# Patient Record
Sex: Male | Born: 1935 | Race: White | Hispanic: No | Marital: Married | Smoking: Former smoker
Health system: Southern US, Community
[De-identification: ages and names within clinical notes are randomized; demographics above are authoritative.]

## PROBLEM LIST (undated history)

## (undated) DIAGNOSIS — N184 Chronic kidney disease, stage 4 (severe): Secondary | ICD-10-CM

## (undated) DIAGNOSIS — R809 Proteinuria, unspecified: Secondary | ICD-10-CM

## (undated) DIAGNOSIS — Z87442 Personal history of urinary calculi: Secondary | ICD-10-CM

## (undated) DIAGNOSIS — Z8739 Personal history of other diseases of the musculoskeletal system and connective tissue: Secondary | ICD-10-CM

## (undated) DIAGNOSIS — M1990 Unspecified osteoarthritis, unspecified site: Secondary | ICD-10-CM

## (undated) DIAGNOSIS — E7800 Pure hypercholesterolemia, unspecified: Secondary | ICD-10-CM

## (undated) DIAGNOSIS — I10 Essential (primary) hypertension: Secondary | ICD-10-CM

## (undated) DIAGNOSIS — I350 Nonrheumatic aortic (valve) stenosis: Secondary | ICD-10-CM

## (undated) DIAGNOSIS — K219 Gastro-esophageal reflux disease without esophagitis: Secondary | ICD-10-CM

## (undated) DIAGNOSIS — I2510 Atherosclerotic heart disease of native coronary artery without angina pectoris: Secondary | ICD-10-CM

## (undated) HISTORY — DX: Chronic kidney disease, stage 4 (severe): N18.4

## (undated) HISTORY — PX: CORONARY ANGIOPLASTY: SHX604

## (undated) HISTORY — DX: Proteinuria, unspecified: R80.9

## (undated) HISTORY — PX: CATARACT EXTRACTION: SUR2

## (undated) HISTORY — DX: Essential (primary) hypertension: I10

## (undated) HISTORY — DX: Atherosclerotic heart disease of native coronary artery without angina pectoris: I25.10

## (undated) HISTORY — DX: Nonrheumatic aortic (valve) stenosis: I35.0

---

## 2006-08-05 ENCOUNTER — Ambulatory Visit (HOSPITAL_COMMUNITY): Admission: RE | Admit: 2006-08-05 | Discharge: 2006-08-05 | Payer: Self-pay | Admitting: Ophthalmology

## 2009-07-02 DIAGNOSIS — I350 Nonrheumatic aortic (valve) stenosis: Secondary | ICD-10-CM

## 2009-07-02 HISTORY — DX: Nonrheumatic aortic (valve) stenosis: I35.0

## 2009-07-03 ENCOUNTER — Ambulatory Visit (HOSPITAL_COMMUNITY): Admission: RE | Admit: 2009-07-03 | Discharge: 2009-07-03 | Payer: Self-pay | Admitting: Internal Medicine

## 2009-07-03 ENCOUNTER — Ambulatory Visit: Payer: Self-pay | Admitting: Cardiology

## 2009-07-03 ENCOUNTER — Encounter (INDEPENDENT_AMBULATORY_CARE_PROVIDER_SITE_OTHER): Payer: Self-pay | Admitting: Internal Medicine

## 2009-08-07 ENCOUNTER — Ambulatory Visit: Payer: Self-pay | Admitting: Cardiology

## 2009-08-09 ENCOUNTER — Encounter: Payer: Self-pay | Admitting: Cardiology

## 2009-08-22 ENCOUNTER — Ambulatory Visit: Payer: Self-pay | Admitting: Cardiology

## 2009-08-22 ENCOUNTER — Encounter: Payer: Self-pay | Admitting: Cardiology

## 2009-08-22 ENCOUNTER — Ambulatory Visit (HOSPITAL_COMMUNITY): Admission: RE | Admit: 2009-08-22 | Discharge: 2009-08-22 | Payer: Self-pay | Admitting: Cardiology

## 2009-08-26 ENCOUNTER — Ambulatory Visit: Payer: Self-pay | Admitting: Cardiology

## 2009-08-26 DIAGNOSIS — I10 Essential (primary) hypertension: Secondary | ICD-10-CM | POA: Insufficient documentation

## 2009-08-26 LAB — CONVERTED CEMR LAB
BUN: 23 mg/dL (ref 6–23)
Basophils Absolute: 0 10*3/uL (ref 0.0–0.1)
CO2: 28 meq/L (ref 19–32)
Calcium: 9.6 mg/dL (ref 8.4–10.5)
Eosinophils Absolute: 0.1 10*3/uL (ref 0.0–0.7)
Eosinophils Relative: 2 % (ref 0–5)
Lymphocytes Relative: 35 % (ref 12–46)
MCV: 96.9 fL (ref 78.0–100.0)
Prothrombin Time: 13.3 s (ref 11.6–15.2)
RDW: 14 % (ref 11.5–15.5)
Sodium: 138 meq/L (ref 135–145)
WBC: 6.3 10*3/uL (ref 4.0–10.5)
aPTT: 28 s (ref 24–37)

## 2009-08-27 ENCOUNTER — Observation Stay (HOSPITAL_COMMUNITY)
Admission: RE | Admit: 2009-08-27 | Discharge: 2009-08-28 | Payer: Self-pay | Source: Home / Self Care | Admitting: Cardiovascular Disease

## 2009-08-27 ENCOUNTER — Ambulatory Visit: Payer: Self-pay | Admitting: Internal Medicine

## 2009-08-27 ENCOUNTER — Encounter: Payer: Self-pay | Admitting: Cardiology

## 2009-09-01 DIAGNOSIS — I2510 Atherosclerotic heart disease of native coronary artery without angina pectoris: Secondary | ICD-10-CM

## 2009-09-01 HISTORY — DX: Atherosclerotic heart disease of native coronary artery without angina pectoris: I25.10

## 2009-09-04 ENCOUNTER — Ambulatory Visit: Payer: Self-pay | Admitting: Cardiovascular Disease

## 2009-09-04 ENCOUNTER — Inpatient Hospital Stay (HOSPITAL_COMMUNITY): Admission: RE | Admit: 2009-09-04 | Discharge: 2009-09-05 | Payer: Self-pay | Admitting: Cardiovascular Disease

## 2009-09-26 ENCOUNTER — Ambulatory Visit: Payer: Self-pay | Admitting: Cardiology

## 2009-09-26 DIAGNOSIS — I359 Nonrheumatic aortic valve disorder, unspecified: Secondary | ICD-10-CM | POA: Insufficient documentation

## 2009-09-26 DIAGNOSIS — I2510 Atherosclerotic heart disease of native coronary artery without angina pectoris: Secondary | ICD-10-CM

## 2009-09-26 DIAGNOSIS — E785 Hyperlipidemia, unspecified: Secondary | ICD-10-CM

## 2009-09-27 ENCOUNTER — Encounter: Payer: Self-pay | Admitting: Cardiology

## 2009-10-01 ENCOUNTER — Encounter: Payer: Self-pay | Admitting: Cardiology

## 2009-10-02 ENCOUNTER — Encounter: Payer: Self-pay | Admitting: Cardiology

## 2009-10-02 LAB — CONVERTED CEMR LAB
Bilirubin, Direct: 0.1 mg/dL (ref 0.0–0.3)
Cholesterol: 171 mg/dL (ref 0–200)
HDL: 25 mg/dL — ABNORMAL LOW (ref >39)
LDL Cholesterol: 116 mg/dL — ABNORMAL HIGH (ref 0–99)

## 2009-10-15 ENCOUNTER — Encounter: Payer: Self-pay | Admitting: Cardiology

## 2009-11-25 ENCOUNTER — Encounter (HOSPITAL_COMMUNITY): Admission: RE | Admit: 2009-11-25 | Discharge: 2009-12-25 | Payer: Self-pay | Admitting: Cardiology

## 2009-12-06 ENCOUNTER — Encounter (INDEPENDENT_AMBULATORY_CARE_PROVIDER_SITE_OTHER): Payer: Self-pay | Admitting: *Deleted

## 2009-12-26 ENCOUNTER — Encounter (HOSPITAL_COMMUNITY): Admission: RE | Admit: 2009-12-26 | Discharge: 2010-01-25 | Payer: Self-pay | Admitting: Cardiology

## 2010-01-03 ENCOUNTER — Encounter: Payer: Self-pay | Admitting: Cardiology

## 2010-01-03 LAB — CONVERTED CEMR LAB
ALT: 9 units/L (ref 0–53)
Albumin: 4.1 g/dL (ref 3.5–5.2)
Alkaline Phosphatase: 62 units/L (ref 39–117)
Cholesterol: 105 mg/dL (ref 0–200)
Indirect Bilirubin: 0.3 mg/dL (ref 0.0–0.9)
Total Protein: 7.2 g/dL (ref 6.0–8.3)
VLDL: 25 mg/dL (ref 0–40)

## 2010-01-15 ENCOUNTER — Ambulatory Visit: Payer: Self-pay | Admitting: Cardiology

## 2010-01-22 ENCOUNTER — Encounter: Payer: Self-pay | Admitting: Cardiology

## 2010-01-27 ENCOUNTER — Encounter (HOSPITAL_COMMUNITY): Admission: RE | Admit: 2010-01-27 | Discharge: 2010-02-26 | Payer: Self-pay | Admitting: Cardiology

## 2010-04-15 LAB — CONVERTED CEMR LAB
ALT: 8 units/L (ref 0–53)
AST: 13 units/L (ref 0–37)
Albumin: 4.2 g/dL (ref 3.5–5.2)
Bilirubin, Direct: 0.1 mg/dL (ref 0.0–0.3)
LDL Cholesterol: 76 mg/dL (ref 0–99)
Total CHOL/HDL Ratio: 4.8
Total Protein: 7.2 g/dL (ref 6.0–8.3)
VLDL: 27 mg/dL (ref 0–40)

## 2010-04-17 ENCOUNTER — Encounter (INDEPENDENT_AMBULATORY_CARE_PROVIDER_SITE_OTHER): Payer: Self-pay | Admitting: *Deleted

## 2010-04-17 ENCOUNTER — Ambulatory Visit: Payer: Self-pay | Admitting: Cardiology

## 2010-06-03 NOTE — Miscellaneous (Signed)
Summary:  CARDIAC  [facility]   Imported By: Faythe Ghee 01/22/2010 12:57:40  _____________________________________________________________________  External Attachment:    Type:   Image     Comment:   External Document

## 2010-06-03 NOTE — Assessment & Plan Note (Signed)
Summary: ROV POST STRESS ECHO   Visit Type:  Follow-up Primary Provider:  Dr. Dwana Melena   History of Present Illness: 77 year old male presents for a followup visit. He is here with his son today. He was seen recently for evaluation of syncope, that by description was suggestive of a neurocardiogenic etiology. He had also been experiencing shortness of breath with activity more chronically, and was referred for baseline ischemic evaluation in light of his cardiac risk factors.  Exercise echocardiogram results are outlined below, significantly abnormal, and suggestive of ischemic heart disease. I reviewed these results with the patient and his son, explaining the implications, and rationale for further diagnostic testing. We discussed the potential risks and benefits of a diagnostic heart catheterization, and he is in agreement to proceed.  From a symptom perspective, and denies any angina. He has not required any sublingual nitroglycerin. He reports stable NYHA class 2-3 dyspnea on exertion.  Current Medications (verified): 1)  Lisinopril-Hydrochlorothiazide 10-12.5 Mg Tabs (Lisinopril-Hydrochlorothiazide) .... Take 1 Tablet By Mouth Once A Day 2)  Omeprazole 20 Mg Cpdr (Omeprazole) .... Take 1 Tablet By Mouth Once A Day 3)  Aspir-Trin 325 Mg Tbec (Aspirin) .... Take 1 Tab Daily  Allergies (verified): 1)  ! 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 Wall thickness was     increased increased in a pattern of mild to moderate LVH. There     was moderate focal basal hypertrophy of the septum. Systolic     function was normal. The estimated ejection fraction was in the     range of 60% to 65%. Wall motion was normal; there were no     regional wall motion abnormalities.   - Aortic valve: Moderately calcified annulus. Possibly bicuspid;     mildly thickened, moderately calcified leaflets. Transvalvular     velocity was minimally increased. There was very mild stenosis.     Valve area: 1.83cm^2(VTI). Valve area: 1.74cm^2 (Vmax).   - Mitral valve: Calcified annulus.   - Left atrium: The atrium was mildly dilated.   - Pulmonary arteries: PA peak pressure: 32mm Hg (S).   Transthoracic echocardiography. M-mode, complete 2D, spectral   Doppler, and color Doppler. Height: Height: 177.8cm. Height: 70in.   Weight: Weight: 75.3kg. Weight: 165.7lb. Body mass index: BMI:   23.8kg/m^2. Body surface area: BSA: 1.10m^2. Patient status:   Outpatient. Location: Echo laboratory.    --------------------------------------------------------------------    -------------------------------------------------------------------- (07/03/2009)    Review of Systems       The patient complains of dyspnea on exertion.  The patient denies fever, chest pain, syncope, peripheral edema, headaches, hemoptysis, abdominal pain, melena, hematochezia, and severe indigestion/heartburn.         Otherwise reviewed and negative.  Vital Signs:  Patient profile:   77 year old male Weight:      162 pounds Pulse rate:   61 / minute BP sitting:   121 / 60  (right arm)  Vitals Entered By: Dreama Saa, CNA (  August 26, 2009 12:45 PM)  Physical Exam  Additional Exam:  Well-nourished appearing elderly male in no acute distress. HEENT: Conjunctiva and lids normal,  oropharynx with moist mucosa. Neck: Supple, no elevated JVP or loud bruits, no thyromegaly. Lungs: Clear to auscultation, nonlabored. Cardiac: Regular rate and rhythm, 2/6 systolic murmur heard at the base, second heart sound preserved, no S3 or pericardial rub. Abdomen: Soft, nontender, bowel sounds present. Skin: Warm and dry. Extremities: No pitting edema, distal pulses are 2+. Musculoskeletal: No gross deformities. Neuropsychiatric: Alert and oriented x3, affect appropriate.   Stress Echocardiogram  Procedure date:  08/22/2009  Findings:       CONCLUSIONS:   1. Significantly abnormal ST-segment changes consistent with ischemia       as noted above, at a maximum workload of 7.0 METs.  Peak blood       pressure was 170/62, and the patient experienced no chest pain,       with only mild dyspnea on exertion.  ST-segment changes resolved by       11 minutes in recovery.  No other dysrhythmias were       noted.   2. Inducible anteroseptal, apical, and inferoapical akinesis       consistent with ischemia, at least in the left anterior descending       distribution, potentially multivessel based on degree of ST-segment       change.  EKG  Procedure date:  08/26/2009  Findings:      Normal sinus rhythm at 61 beats per minute with high lateral Q waves.  Impression & Recommendations:  Problem # 1:  CARDIOVASCULAR FUNCTION STUDY, ABNORMAL (ICD-794.30)  Significantly abnormal screening exercise echocardiogram, with ST segment abnormalities and inducible wall motion abnormalities, suggestive of underlying ischemic heart disease, possibly even multivessel. Results were discussed with the patient and his son. Plan is for a diagnostic cardiac catheterization tomorrow for further definition of coronary anatomy, and evaluation for revascularization options. Patient will continue on his present medications, which at this point are fairly limited. He is otherwise hemodynamically stable. Further  plans to follow. Baseline chest x-ray and labs will be obtained today.  Problem # 2:  ESSENTIAL HYPERTENSION, BENIGN (ICD-401.1)  Blood pressure well controlled today.  His updated medication list for this problem includes:    Lisinopril-hydrochlorothiazide 10-12.5 Mg Tabs (Lisinopril-hydrochlorothiazide) .Marland Kitchen... Take 1 tablet by mouth once a day    Aspir-trin 325 Mg Tbec (Aspirin) .Marland Kitchen... Take 1 tab daily  Problem # 3:  DYSPNEA (ICD-786.05)  Possibly ischemic in etiology. Further testing as outlined above.  His updated medication list for this problem includes:    Lisinopril-hydrochlorothiazide 10-12.5 Mg Tabs (Lisinopril-hydrochlorothiazide) .Marland Kitchen... Take 1 tablet by mouth once a day    Aspir-trin 325 Mg Tbec (Aspirin) .Marland Kitchen... Take 1 tab daily  Problem # 4:  SYNCOPE, HX OF (ICD-V12.49)  No recent recurrence. Description was most consistent with a neurocardiogenic etiology. Not clearly correlated with underlying ischemic heart disease, although further assessment is pending.  Other Orders: T-Basic Metabolic Panel [id number]) T-CBC w/Diff [phone]) T-PTT [phone]) T-Protime, Auto [phone]) Cardiac Catheterization (Cardiac Cath) T-Chest x-ray, 2 views ([id number])  Patient Instructions: 1)  Your physician recommends that you schedule a follow-up appointment in: post Cath 2)  Your physician recommends that you return for lab work in: Today  3)  A chest x-ray takes a picture of the organs and structures inside the chest, including the heart, lungs, and blood vessels. This test can show several things, including, whether the  heart is enlarged; whether fluid is building up in the lungs; and whether pacemaker / defibrillator leads are still in place. 4)  Your physician has requested that you have a cardiac catheterization.  Cardiac catheterization is used to diagnose and/or treat various heart conditions. Doctors may recommend this procedure for a number of different reasons. The most  common reason is to evaluate chest pain. Chest pain can be a symptom of coronary artery disease (CAD), and cardiac catheterization can show whether plaque is narrowing or blocking your heart's arteries. This procedure is also used to evaluate the valves, as well as measure the blood flow and oxygen levels in different parts of your heart.  For further information please visit [email or web address]  Please follow instruction sheet, as given.

## 2010-06-03 NOTE — Letter (Signed)
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 [place] [address]   Phone: [phone]  Fax: [phone]     October 02, 2009 MRN: [id number]   MOUA RASMUSSON [address] [place], [place]  [address]      YOUR LAB WORK IS DUE  December 30, 2009 _________________________________________  Please go to [facility], located across the street from [facility] on the second floor.  Hours are Monday - Friday 7am until 7:30pm         Saturday 8am until 12noon    _X_  DO NOT EAT OR DRINK AFTER MIDNIGHT EVENING PRIOR TO LABWORK  __ YOUR LABWORK IS NOT FASTING --YOU MAY EAT PRIOR TO LABWORK

## 2010-06-03 NOTE — Letter (Signed)
Summary: [place] Results Engineer, agricultural at [facility]  [address] [place] [address]   Phone: [phone]  Fax: [phone]      January 03, 2010 MRN: [id number]   Brian [name] [address] [place], [place]  [address]   Dear [name],  Your test ordered by Selena Batten has been reviewed by your physician (or physician assistant) and was found to be normal or stable. Your physician (or physician assistant) felt no changes were needed at this time.  ____ Echocardiogram  ____ Cardiac Stress Test  __X__ Lab Work  ____ Peripheral vascular study of arms, legs or neck  ____ CT scan or X-ray  ____ Lung or Breathing test  ____ Other:  Pleae continue on current medical treatment.   Thank you.   Nona Dell, MD, F.A.C.C

## 2010-06-03 NOTE — Letter (Signed)
Summary: Stress Echocardiogram Information Sheet  [facility]  [address], [place] [address]   Phone: [phone]  Fax: [phone]      August 07, 2009 MRN: [id number] light prior to the test.   Brandy Hale  Doctor: Appointment Date: Appointment Time: Appointment Location: [facility]  Stress Echocardiogram Information Sheet    Instructions:   1. DO NOT  take your Hydrochlorothide medicine the morning of test.  2. Do not eat or drink after midnight the night before test.  3. Dress prepared to exercise.  4. DO NOT use ANY caffine or tobacco products 3 hours before appointment.  5. Report to the Short Stay Center on the1st floor.  6. Please bring all current prescription medications.  7. If you have any questions, please call [phone]

## 2010-06-03 NOTE — Assessment & Plan Note (Signed)
Summary: 58MONTH/RM   Visit Type:  Follow-up Primary Brian [name]:  Dr. Dwana Melena   History of Present Illness: 77 year old male presents for followup. I saw him back in May. He has been working with cardiac rehabilitation. He reports doing quite well without any significant angina or unusual breathlessness. No syncope. 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  Followup labs on 29 August showed cholesterol 105, triglycerides 126, HDL 25, LDL 55, AST 15, ALT 9. Results done in followup while on Zocor. I reviewed these with him today. He is tolerating Zocor.  I underscored the importance of continued dual antiplatelet therapy for one year following intervention with DES to the circumflex.  Current Medications (verified): 1)  Lisinopril-Hydrochlorothiazide 10-12.5 Mg Tabs (Lisinopril-Hydrochlorothiazide) .... Take 1 Tablet By Mouth Once A Day 2)  Aspir-Trin 325 Mg Tbec (Aspirin) .... Take 1 Tab Daily 3)  Tylenol 325 Mg Tabs (Acetaminophen) .... Take As Needed For Pain 4)  Toprol Xl 25 Mg Xr24h-Tab (Metoprolol Succinate) .... Take 1 Tab Daily 5)  Pepcid 20 Mg Tabs (Famotidine) .... Take 1 Tab Daily 6)  Plavix 75 Mg Tabs (Clopidogrel Bisulfate) .... Take 1 Tab Daily 7)  Nitrostat 0.4 Mg Subl (Nitroglycerin) .... Take As Directed For Chest Pain Not To Exceed 3 8)  Zocor 20 Mg Tabs (Simvastatin) .... Take 1 Tablet By Mouth At Bedtime  Allergies (verified): 1)  ! 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         Otherwise reviewed and negative.  Vital Signs:  Patient profile:   77 year old male Weight:      161 pounds BMI:     23.18 Pulse rate:   58 / minute BP sitting:   121 / 41  (right arm)  Vitals Entered By: Dreama Saa, CNA (January 15, 2010 1:06 PM)  Physical Exam  Additional Exam:  Well-nourished appearing elderly male in no acute distress. HEENT: Conjunctiva and lids normal, oropharynx with moist mucosa. Neck: Supple, no elevated JVP or loud bruits, no thyromegaly. Lungs: Clear to auscultation, nonlabored. Cardiac: Regular rate and rhythm, 2/6 systolic murmur heard at the base, second heart sound preserved, no S3 or pericardial rub. Abdomen: Soft, nontender, bowel sounds present. Skin: Warm and dry. Extremities: No pitting edema, distal pulses are 2+. Stable catheterization sites, right wrist and right groin. Musculoskeletal: No gross deformities. Neuropsychiatric: Alert and oriented x3, affect appropriate.   Impression & Recommendations:  Problem # 1:  CORONARY ATHEROSCLEROSIS NATIVE CORONARY ARTERY (ICD-414.01)  Doing well, symptomatically stable on medical therapy. Continue cardiac rehabilitation. Remains on dual antiplatelet therapy. Followup planned for 3 months.  His updated medication list for this problem includes:    Lisinopril-hydrochlorothiazide 10-12.5 Mg Tabs (Lisinopril-hydrochlorothiazide) .Marland Kitchen... Take 1 tablet by mouth once a day    Aspir-trin 325 Mg Tbec (Aspirin) .Marland Kitchen... Take 1 tab daily    Toprol Xl 25 Mg Xr24h-tab (Metoprolol succinate) .Marland Kitchen... Take 1 tab daily    Plavix 75 Mg Tabs (Clopidogrel bisulfate) .Marland Kitchen... Take 1 tab daily    Nitrostat 0.4  Mg Subl (Nitroglycerin) .Marland Kitchen... Take as directed for chest pain not to exceed 3  Problem # 2:  HYPERLIPIDEMIA (ICD-272.4)  LDL much better control. Liver function tests normal. Followup labs prior to next visit. He is tolerating Zocor.  His updated medication list for this problem  includes:    Zocor 20 Mg Tabs (Simvastatin) .Marland Kitchen... Take 1 tablet by mouth at bedtime  Future Orders: T-Lipid Profile ([id number]) ... 04/14/2010 T-Hepatic Function [phone]) ... 04/14/2010  Problem # 3:  ESSENTIAL HYPERTENSION, BENIGN (ICD-401.1)  Blood pressure well controlled.  His updated medication list for this problem includes:    Lisinopril-hydrochlorothiazide 10-12.5 Mg Tabs (Lisinopril-hydrochlorothiazide) .Marland Kitchen... Take 1 tablet by mouth once a day    Aspir-trin 325 Mg Tbec (Aspirin) .Marland Kitchen... Take 1 tab daily    Toprol Xl 25 Mg Xr24h-tab (Metoprolol succinate) .Marland Kitchen... Take 1 tab daily  Problem # 4:  AORTIC VALVE DISORDERS (ICD-424.1)  Mild aortic valve stenosis, stable.  His updated medication list for this problem includes:    Lisinopril-hydrochlorothiazide 10-12.5 Mg Tabs (Lisinopril-hydrochlorothiazide) .Marland Kitchen... Take 1 tablet by mouth once a day    Toprol Xl 25 Mg Xr24h-tab (Metoprolol succinate) .Marland Kitchen... Take 1 tab daily    Nitrostat 0.4 Mg Subl (Nitroglycerin) .Marland Kitchen... Take as directed for chest pain not to exceed 3  Patient Instructions: 1)  Your physician recommends that you schedule a follow-up appointment in: 3 months 2)  Your physician recommends that you return for lab work in: 3 months, just before next visit 3)  Your physician recommends that you continue on your current medications as directed. Please refer to the Current Medication list given to you today.

## 2010-06-03 NOTE — Miscellaneous (Signed)
Summary: Rehab Report  Rehab Report   Imported By: Faythe Ghee 01/15/2010 16:38:14  _____________________________________________________________________  External Attachment:    Type:   Image     Comment:   External Document

## 2010-06-03 NOTE — Letter (Signed)
Summary: Cardiac Catheterization Instructions- Main Lab   [facility]  [address], [place] [address]   Phone: [phone]  Fax: [phone]     08/26/2009 MRN: [id number]  Brian [name] [address] [place], [place]  [address]  Dear [name], Brian [name] are scheduled for Cardiac Catheterization on   08-27-09            with Dr. Excell Seltzer          .  Please arrive at the [facility] at 11:00am      on the day of your procedure.  1. DIET     __X__ Nothing to eat or drink after midnight except your medications with a sip of water.  2. MAKE SURE YOU TAKE YOUR ASPIRIN.  3. __X___ DO NOT TAKE these medications before your procedure: Lisinopril-Hydrochlorothiazide         ________________________________________________________________________________      __X__ Bonita Quin MAY TAKE ALL of your remaining medications with a small amount of water.         4. Plan for one night stay - bring personal belongings (i.e. toothpaste, toothbrush, etc.)  5. Bring a current list of your medications and current insurance cards.  6. Must have a responsible person to drive you home.   7. Someone must be with you for the first 24 hours after you arrive home.  8. Please wear clothes that are easy to get on and off and wear slip-on shoes.  *Special note: Every effort is made to have your procedure done on time.  Occasionally there are emergencies that present themselves at the hospital that may cause delays.  Please be patient if a delay does occur.  If you have any questions after you get home, please call the office at the number listed above.  Larita Fife Via LPN

## 2010-06-03 NOTE — Letter (Signed)
Summary: HISTORY FORM  HISTORY FORM   Imported By: Faythe Ghee 08/09/2009 09:58:49  _____________________________________________________________________  External Attachment:    Type:   Image     Comment:   External Document

## 2010-06-03 NOTE — Letter (Signed)
Summary: Appointment - Reschedule  [facility]  [address], [place] [address]   Phone: [phone]  Fax: [phone]     December 06, 2009 MRN: [id number]   Brian [name] [address] [place], [place]  [address]   Dear Brian [name],   Due to a change in our office schedule, your appointment on      01/01/10    at   11:00 was changed to 01/01/10 at   9:00. Please contact us at the number listed above with any questions.   Sincerely,  Glass blower/designer

## 2010-06-03 NOTE — Assessment & Plan Note (Signed)
Summary: **PER DR.HALL FOR SYNCOPE/TG   Visit Type:  Initial Consult Primary Provider:  Dr. Dwana Melena   History of Present Illness: 77 year old male presents for a cardiology consultation. He is referred following a recent episode of syncope. 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 He plays the guitar and saxophone among other instruments. He was playing in a performance a few weeks ago, and during the last number while he was singing and playing the guitar, he began to feel "hot" followed by dizziness, diaphoresis, and nausea. He was able to finish the song, walked off the stage, and when he was trying to pull up a chair to be seated, he had an episode of syncope. He was cought by a nearby person, and eased to the floor. After he woke up he had a brief episode of emesis, and then felt "much better." He was out for approximately 1 minute. At no time did he experience any chest pain, palpitations, or breathlessness. This has not recurred since.  He states that when he was a child he used to "pass out" at the site of blood. This has not been a major recurrent symptom however in his adult years.  He states he's felt somewhat "dizzy" since being on his antihypertensive. He also states he feels like he has to drink more fluid since being on this medication.  He also reports an episode of near syncope several years ago when he stood up too quickly.  Unrelated to these symptoms, he has generally felt somewhat more fatigue and shortness of breath with activity over the last year. He does not report any Zacharia exertional chest pain however.  He has only recently established with a primary care provider, having not seen a physician for the preceding 30 years.  Clinical Review Panels:  Echocardiogram Echocardiogram  Study Conclusions    - Left ventricle: The cavity size was normal. Wall thickness was     increased increased in a pattern  of mild to moderate LVH. There     was moderate focal basal hypertrophy of the septum. Systolic     function was normal. The estimated ejection fraction was in the     range of 60% to 65%. Wall motion was normal; there were no     regional wall motion abnormalities.   - Aortic valve: Moderately calcified annulus. Possibly bicuspid;     mildly thickened, moderately calcified leaflets. Transvalvular     velocity was minimally increased. There was very mild stenosis.     Valve area: 1.83cm^2(VTI). Valve area: 1.74cm^2 (Vmax).   - Mitral valve: Calcified annulus.   - Left atrium: The atrium was mildly dilated.   - Pulmonary arteries: PA peak pressure: 32mm Hg (S).   Transthoracic echocardiography. M-mode, complete 2D, spectral   Doppler, and color Doppler. Height: Height: 177.8cm. Height: 70in.   Weight: Weight: 75.3kg. Weight: 165.7lb. Body mass index: BMI:   23.8kg/m^2. Body surface area: BSA: 1.57m^2. Patient status:   Outpatient. Location: Echo laboratory.    --------------------------------------------------------------------    -------------------------------------------------------------------- (07/03/2009)    Preventive Screening-Counseling & Management  Alcohol-Tobacco     Smoking Status: quit     Year Quit: 1990  Current Medications (verified): 1)  Lisinopril-Hydrochlorothiazide 10-12.5 Mg Tabs (Lisinopril-Hydrochlorothiazide) .... Take 1 Tablet By Mouth Once A Day 2)  Omeprazole 20 Mg Cpdr (Omeprazole) .... Take 1 Tablet By Mouth Once A Day  Allergies (verified): 1)  ! Codeine  Comments:  Nurse/Medical Assistant: The patient's medications and allergies were reviewed with the patient and were updated in the Medication and Allergy Lists. List reviewed.  Past History:  Family History: Last updated: 08/07/2009 Noncontributory for premature cardiovascular disease Siblings: Brother with pacemaker at age 85  Social History: Last updated: 08/07/2009 Retired -  previously worked with [employer] Tobacco Use - Former (quit 1990) Alcohol Use - no  Past Medical History: Very mild aortic stenosis, possibly bicuspid valve with moderate calcification, 3/11 Hypertension  Past Surgical History: Bilateral cataract surgery  Family History: Noncontributory for premature cardiovascular disease Siblings: Brother with pacemaker at age 62  Social History: Retired - previously worked with [employer] company Tobacco Use - Former (quit 1990) Alcohol Use - no Smoking Status:  quit  Review of Systems  The patient denies anorexia, fever, weight loss, vision loss, chest pain, peripheral edema, prolonged cough, headaches, hemoptysis, abdominal pain, melena, hematochezia, and severe indigestion/heartburn.         Otherwise reviewed and negative.  Vital Signs:  Patient profile:   77 year old male Height:      70 inches Weight:      163 pounds BMI:     23.47 Pulse rate:   62 / minute BP sitting:   149 / 59  (left arm) Cuff size:   regular  Vitals Entered By: Carlye Grippe (August 07, 2009 3:36 PM)  Physical Exam  Additional Exam:  Well-nourished appearing elderly male in no acute distress. HEENT: Conjunctiva and lids normal, oropharynx with moist mucosa. Neck: Supple, no elevated JVP or loud bruits, no thyromegaly. Lungs: Clear to auscultation, nonlabored. Cardiac: Regular rate and rhythm, 2/6 systolic murmur heard at the base, second heart sound preserved, no S3 or pericardial rub. Abdomen: Soft, nontender, bowel sounds present. Skin: Warm and dry. Extremities: No pitting edema, distal pulses are 2+. Musculoskeletal: No gross deformities. Neuropsychiatric: Alert and oriented x3, affect appropriate.   EKG  Procedure date:  08/07/2009  Findings:      Sinus rhythm at 69 beats per minute with counterclockwise rotation, nonspecific ST changes, likely early repolarization.  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 This may also be contributed to by the patient's antihypertensive including a low-dose diuretic. We discussed this some today, and talked about maintaining a adequately hydrated status, and also trying to assume a seated or supine position with warning symptoms. He has had no palpitations to suspect dysrhythmia. Fortunately he also underwent a recent echocardiogram demonstrating normal left ventricular systolic function. He has very mild aortic stenosis, not likely a contributed factor.  Orders: EKG w/ Interpretation (93000)  Problem # 2:  DYSPNEA (ICD-786.05)  Perceived increase in fatigue and shortness of breath with activity over the last year, not associated with exertional chest pain. Resting electrocardiogram is nonspecific. This seems to be separate from the symptoms discussed above. He does have hypertension, and has not had regular medical followup over the last many years. We discussed these issues today, and plan basic ischemic evaluation via an exercise echocardiogram. If this is low risk, would likely continue with symptom observation and medical therapy, otherwise we can consider further evaluation. I will schedule a followup visit to discuss the results.  His updated medication list for this problem includes:    Lisinopril-hydrochlorothiazide 10-12.5 Mg Tabs (Lisinopril-hydrochlorothiazide) .Marland Kitchen... Take 1 tablet by mouth  once a day  Patient Instructions: 1)  Your physician recommends that you schedule a follow-up appointment in: 3 to 4 weeks 2)  Your physician recommends that you continue on your current medications as directed. Please refer to the Current Medication list given to you today. 3)  Your physician has requested that you have a stress echocardiogram. For further information please visit [email or web address]  Please follow instruction sheet as  given.

## 2010-06-03 NOTE — Letter (Signed)
Summary: [facility], agricultural at [employer]  [address], [place] [address]   Phone: [phone]  Fax: [phone]     January 15, 2010 MRN: [id number]   Brian [name] [address] [place], [place]  [address]      YOUR LAB WORK IS DUE  April 14, 2010 _________________________________________  Please go to [facility], located across the street from [facility] on the second floor.  Hours are Monday - Friday 7am until 7:30pm         Saturday 8am until 12noon    _X_  DO NOT EAT OR DRINK AFTER MIDNIGHT EVENING PRIOR TO LABWORK  __ YOUR LABWORK IS NOT FASTING --YOU MAY EAT PRIOR TO LABWORK

## 2010-06-03 NOTE — Miscellaneous (Signed)
Summary: Rehab Report/ FAXED CARDIAC REHAB PROGRAM  Rehab Report/ FAXED CARDIAC REHAB PROGRAM   Imported By: Dorise Hiss 10/18/2009 11:48:53  _____________________________________________________________________  External Attachment:    Type:   Image     Comment:   External Document

## 2010-06-03 NOTE — Assessment & Plan Note (Signed)
Summary: EPH   Visit Type:  Follow-up Primary Provider:  Dr. Dwana Melena   History of Present Illness: 77 year old Brian [name] presents for followup. He was referred for diagnostic cardiac catheterization following an abnormal stress test, with findings of an occluded RCA otherwise diffusely diseased, 80-90% mid circumflex stenosis, and 70-75% mid LAD stenosis. He ultimately underwent a staged intervention with placement of drug-eluting stent within the circumflex on 4 May. The LAD was assessed again, and felt to be closer to 50% by repeat angiography. This was also a fairly tortuous vessel, and felt to be difficult to access, resulting in plan for medical therapy.  He reports doing fairly well. In the first week after discharge he did have some problems with increased reflux symptoms also dizziness, although both have improved. He has continued to walk, and feels that his stamina has improved. 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 He continues to avoid any heavy lifting.  He has not had a recent lipid profile. This has been assessed in the past by Dr. Margo Aye. We talked about this some today, and the likelihood that he will need a statin medication.  Current Medications (verified): 1)  Lisinopril-Hydrochlorothiazide 10-12.5 Mg Tabs (Lisinopril-Hydrochlorothiazide) .... Take 1 Tablet By Mouth Once A Day 2)  Aspir-Trin 325 Mg Tbec (Aspirin) .... Take 1 Tab Daily 3)  Tylenol 325 Mg Tabs (Acetaminophen) .... Take As Needed For Pain 4)  Toprol Xl 25 Mg Xr24h-Tab (Metoprolol Succinate) .... Take 1 Tab Daily 5)  Pepcid 20 Mg Tabs (Famotidine) .... Take 1 Tab Daily 6)  Plavix 75 Mg Tabs (Clopidogrel Bisulfate) .... Take 1 Tab Daily 7)  Nitrostat 0.4 Mg Subl (Nitroglycerin) .... Take As Directed For Chest Pain Not To Exceed 3  Allergies (verified): 1)  ! Codeine  Past History:  Social History: Last updated: 08/07/2009 Retired - previously worked with [employer] company Tobacco Use -  Former (quit 1990) Alcohol Use - no  Past Medical History: Very mild aortic stenosis, possibly bicuspid valve with moderate calcification, 3/11 Hypertension CAD - multivessel, DES circumflex 5/11, 50% LAD, occluded RCA, LVEF 60%  Review of Systems  The patient denies anorexia, fever, chest pain, syncope, peripheral edema, prolonged cough, hemoptysis, melena, and hematochezia.         Otherwise reviewed and negative except as outlined.  Vital Signs:  Patient profile:   77 year old Brian [name] Weight:      163 pounds BMI:     23.47 Pulse rate:   63 / minute BP sitting:   124 / 54  (right arm)  Vitals Entered By: Dreama Saa, CNA (Sep 26, 2009 11:41 AM)  Physical Exam  Additional Exam:  Well-nourished appearing elderly Brian [name] in no acute distress. HEENT: Conjunctiva and lids normal, oropharynx with moist mucosa. Neck: Supple, no elevated JVP or loud bruits, no thyromegaly. Lungs: Clear to auscultation, nonlabored. Cardiac: Regular rate and rhythm, 2/6 systolic murmur heard at the base, second heart sound preserved, no S3 or pericardial rub. Abdomen: Soft, nontender, bowel sounds present. Skin: Warm and dry. Extremities: No pitting edema, distal pulses are 2+. Stable catheterization sites, right wrist and right groin. Musculoskeletal: No gross deformities. Neuropsychiatric: Alert and oriented x3, affect appropriate.   Cardiac Cath  Procedure date:  08/28/2009  Findings:       FINDINGS:  Aortic pressure 100/51 with a mean of 69, left ventricular   pressure 98/8.      Coronary angiography:  The left mainstem is patent.  There is moderate   calcification.  The left main divides into the LAD and left circumflex.      LAD:  The LAD is heavily calcified.  The vessel was tortuous throughout   its proximal segment.  There was nonobstructive stenosis proximally.   The first diagonal is patent with a 50% ostial stenosis.  The mid LAD   has a long segment diffuse 70-75% stenosis that  leads up to the second   diagonal branch which has significant ostial stenosis.  The second   diagonal is fairly small in caliber.  The LAD beyond the second diagonal   has diffuse plaque but no areas of high-grade stenosis, the LAD courses   down to wrap around the left ventricular apex.      Left circumflex:  The left circumflex has severe angulation as it   originates from the distal left mainstem.  The circumflex has mild to   moderate calcification present.  There is a small first OM branch that   has nonobstructive stenosis.  The mid circumflex beyond the OM branch   has tandem lesions of 80-90% that lead into a bifurcation point of the   distal AV groove circumflex and the second obtuse marginal branch.   There is mild calcification in this area.      Right coronary artery:  The right coronary artery is patent but severely   diseased in multiple portions of the vessel.  There are at least five 80-   99% lesions throughout the proximal mid and distal right coronary   artery.  The PDA appears to be totally occluded.  There was a small   posterolateral branch present and there was a RV marginal branch that   originates from the proximal RCA.  There is TIMI II flow into the distal   right coronary artery and some competitive filling from left-to-right   collaterals.  EKG  Procedure date:  09/26/2009  Findings:      Normal sinus rhythm at 61 beats per minutes, small high lateral Q waves.  Impression & Recommendations:  Problem # 1:  CORONARY ATHEROSCLEROSIS NATIVE CORONARY ARTERY (ICD-414.01)  Symptomatically stable at this point, status post revascularization with drug-eluting stent placement of the circumflex, otherwise medically managed residual disease. No specific medication adjustments were made today. He will gradually increase his activity, and I will see him back in 3 months.  His updated medication list for this problem includes:    Lisinopril-hydrochlorothiazide  10-12.5 Mg Tabs (Lisinopril-hydrochlorothiazide) .Marland Kitchen... Take 1 tablet by mouth once a day    Aspir-trin 325 Mg Tbec (Aspirin) .Marland Kitchen... Take 1 tab daily    Toprol Xl 25 Mg Xr24h-tab (Metoprolol succinate) .Marland Kitchen... Take 1 tab daily    Plavix 75 Mg Tabs (Clopidogrel bisulfate) .Marland Kitchen... Take 1 tab daily    Nitrostat 0.4 Mg Subl (Nitroglycerin) .Marland Kitchen... Take as directed for chest pain not to exceed 3  His updated medication list for this problem includes:    Lisinopril-hydrochlorothiazide 10-12.5 Mg Tabs (Lisinopril-hydrochlorothiazide) .Marland Kitchen... Take 1 tablet by mouth once a day    Aspir-trin 325 Mg Tbec (Aspirin) .Marland Kitchen... Take 1 tab daily    Toprol Xl 25 Mg Xr24h-tab (Metoprolol succinate) .Marland Kitchen... Take 1 tab daily    Plavix 75 Mg Tabs (Clopidogrel bisulfate) .Marland Kitchen... Take 1 tab daily    Nitrostat 0.4 Mg Subl (Nitroglycerin) .Marland Kitchen... Take as directed for chest pain not to exceed 3  Problem # 2:  HYPERLIPIDEMIA (ICD-272.4)  Followup lipid profile and liver function  tests are needed. I suspect he will need a statin medication. We discussed this today.  Future Orders: T-Lipid Profile [id number]) ... 09/30/2009 T-Hepatic Function [id number]) ... 09/30/2009  Problem # 3:  SYNCOPE, HX OF (ICD-V12.49)  No recurrence. Will continue to monitor.  Problem # 4:  AORTIC VALVE DISORDERS (ICD-424.1)  Very mild aortic stenosis, not clinically significant at this point.  His updated medication list for this problem includes:    Lisinopril-hydrochlorothiazide 10-12.5 Mg Tabs (Lisinopril-hydrochlorothiazide) .Marland Kitchen... Take 1 tablet by mouth once a day    Toprol Xl 25 Mg Xr24h-tab (Metoprolol succinate) .Marland Kitchen... Take 1 tab daily    Nitrostat 0.4 Mg Subl (Nitroglycerin) .Marland Kitchen... Take as directed for chest pain not to exceed 3  Patient Instructions: 1)  Your physician recommends that you schedule a follow-up appointment in: 3 months 2)  Your physician recommends that you return for a FASTING lipid profile: next week 3)  Your physician  recommends that you continue on your current medications as directed. Please refer to the Current Medication list given to you today.

## 2010-06-05 NOTE — Assessment & Plan Note (Signed)
Summary: 3 mth f/u per checkout on 01/15/10/tg   Visit Type:  Follow-up Primary Matheus Spiker:  Dr. Dwana Melena   History of Present Illness: 77 year old male presents for followup. He was seen back in September. Continues to do very well. He completed cardiac rehabilitation and is now exercising at home. He is not reporting any progressive angina. 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  Followup labs from 12 December showed cholesterol 130, triglycerides 133, HDL 27, LDL 76, AST 13, ALT 8. We reviewed these today. He is tolerating his Zocor.  Today we talked about continuing regular exercise, attention to diet and sodium restriction.  Current Medications (verified): 1)  Lisinopril-Hydrochlorothiazide 10-12.5 Mg Tabs (Lisinopril-Hydrochlorothiazide) .... Take 1 Tablet By Mouth Once A Day 2)  Aspir-Trin 325 Mg Tbec (Aspirin) .... Take 1 Tab Daily 3)  Toprol Xl 25 Mg Xr24h-Tab (Metoprolol Succinate) .... Take 1 Tab Daily 4)  Pepcid 20 Mg Tabs (Famotidine) .... Take 1 Tab Daily 5)  Plavix 75 Mg Tabs (Clopidogrel Bisulfate) .... Take 1 Tab Daily 6)  Nitrostat 0.4 Mg Subl (Nitroglycerin) .... Take As Directed For Chest Pain Not To Exceed 3 7)  Zocor 20 Mg Tabs (Simvastatin) .... Take 1 Tablet By Mouth At Bedtime  Allergies (verified): 1)  ! Codeine  Comments:  Nurse/Medical Assistant: no meds no list reviewed med list from previous ov and stated all meds are correct took tylenol offf of his list [facility] is [facility]  Past History:  Past Medical History: Last updated: 09/26/2009 Very mild aortic stenosis, possibly bicuspid valve with moderate calcification, 3/11 Hypertension CAD - multivessel, DES circumflex 5/11, 50% LAD, occluded RCA, LVEF 60%  Social History: Last updated: 08/07/2009 Retired - previously worked with [employer] Tobacco Use - Former (quit 1990) Alcohol Use - no  Review of Systems  The patient denies anorexia, fever,  chest pain, syncope, dyspnea on exertion, peripheral edema, hemoptysis, abdominal pain, melena, hematochezia, and severe indigestion/heartburn.         Otherwise reviewed and negative except as outlined.  Vital Signs:  Patient profile:   77 year old male Weight:      162 pounds O2 Sat:      97 % on Room air Pulse rate:   62 / minute BP sitting:   137 / 61  (right arm)  Vitals Entered By: Dreama Saa, CNA (April 17, 2010 8:22 AM)  O2 Flow:  Room air  Physical Exam  Additional Exam:  Well-nourished appearing elderly male in no acute distress. HEENT: Conjunctiva and lids normal, oropharynx with moist mucosa. Neck: Supple, no elevated JVP or loud bruits, no thyromegaly. Lungs: Clear to auscultation, nonlabored. Cardiac: Regular rate and rhythm, 2/6 systolic murmur heard at the base, second heart sound preserved, no S3 or pericardial rub. Abdomen: Soft, nontender, bowel sounds present. Skin: Warm and dry. Extremities: No pitting edema, distal pulses are 2+. Musculoskeletal: No gross deformities. Neuropsychiatric: Alert and oriented x3, affect appropriate.   Impression & Recommendations:  Problem # 1:  CORONARY ATHEROSCLEROSIS NATIVE CORONARY ARTERY (ICD-414.01)  Patient doing well, symptomatically stable on medical therapy. Continue regular exercise regimen. Followup scheduled for 6 months.  His updated medication list for this problem includes:    Lisinopril-hydrochlorothiazide 10-12.5 Mg Tabs (Lisinopril-hydrochlorothiazide) .Marland Kitchen... Take 1 tablet by mouth once a day    Aspir-trin 325 Mg Tbec (Aspirin) .Marland Kitchen... Take 1 tab daily    Toprol Xl 25 Mg Xr24h-tab (Metoprolol succinate) .Marland Kitchen... Take 1  tab daily    Plavix 75 Mg Tabs (Clopidogrel bisulfate) .Marland Kitchen... Take 1 tab daily    Nitrostat 0.4 Mg Subl (Nitroglycerin) .Marland Kitchen... Take as directed for chest pain not to exceed 3  Future Orders: T-Lipid Profile ([id number]) ... 10/03/2010 T-Renal Function Panel [id number]) ...  10/03/2010  Problem # 2:  AORTIC VALVE DISORDERS (ICD-424.1)  Very mild aortic stenosis with stable cardiac murmur. Continue to follow.  His updated medication list for this problem includes:    Lisinopril-hydrochlorothiazide 10-12.5 Mg Tabs (Lisinopril-hydrochlorothiazide) .Marland Kitchen... Take 1 tablet by mouth once a day    Toprol Xl 25 Mg Xr24h-tab (Metoprolol succinate) .Marland Kitchen... Take 1 tab daily    Nitrostat 0.4 Mg Subl (Nitroglycerin) .Marland Kitchen... Take as directed for chest pain not to exceed 3  Problem # 3:  ESSENTIAL HYPERTENSION, BENIGN (ICD-401.1)  Blood pressure mildly elevated today. Continue present medications, sodium restriction and exercise.  His updated medication list for this problem includes:    Lisinopril-hydrochlorothiazide 10-12.5 Mg Tabs (Lisinopril-hydrochlorothiazide) .Marland Kitchen... Take 1 tablet by mouth once a day    Aspir-trin 325 Mg Tbec (Aspirin) .Marland Kitchen... Take 1 tab daily    Toprol Xl 25 Mg Xr24h-tab (Metoprolol succinate) .Marland Kitchen... Take 1 tab daily  Problem # 4:  HYPERLIPIDEMIA (ICD-272.4)  LDL well controlled with normal liver function tests. Plan to repeat fasting lipid profile and liver function tests prior to next visit.  His updated medication list for this problem includes:    Zocor 20 Mg Tabs (Simvastatin) .Marland Kitchen... Take 1 tablet by mouth at bedtime  Future Orders: T-Lipid Profile ([id number]) ... 10/03/2010 T-Renal Function Panel [id number] [id number]) ... 10/03/2010  Patient Instructions: 1)  Your physician recommends that you schedule a follow-up appointment in: 6 months 2)  Your physician recommends that you return for a FASTING lipid profile: 5 1/2 months

## 2010-06-05 NOTE — Letter (Signed)
Summary: [name], [facility] at [employer]  [address], [place] [address]   Phone: [phone]  Fax: [phone]     April 17, 2010 MRN: [id number]   Brian [name] [address] [place], [place]  [address]      YOUR LAB WORK IS DUE   October 03, 2010  Please go to [facility], located across the street from [facility] on the second floor.  Hours are Monday - Friday 7am until 7:30pm         Saturday 8am until 12noon    _X_  DO NOT EAT OR DRINK AFTER MIDNIGHT EVENING PRIOR TO LABWORK

## 2010-06-17 ENCOUNTER — Encounter: Payer: Self-pay | Admitting: Adult Health

## 2010-06-17 ENCOUNTER — Ambulatory Visit (INDEPENDENT_AMBULATORY_CARE_PROVIDER_SITE_OTHER): Payer: Medicare Other | Admitting: Adult Health

## 2010-06-17 DIAGNOSIS — I2510 Atherosclerotic heart disease of native coronary artery without angina pectoris: Secondary | ICD-10-CM

## 2010-06-17 DIAGNOSIS — D649 Anemia, unspecified: Secondary | ICD-10-CM

## 2010-06-17 DIAGNOSIS — R55 Syncope and collapse: Secondary | ICD-10-CM

## 2010-06-18 ENCOUNTER — Encounter: Payer: Self-pay | Admitting: Adult Health

## 2010-06-25 NOTE — Letter (Signed)
Summary: [facility]  [facility]   Imported By: Faythe Ghee 06/18/2010 10:20:57  _____________________________________________________________________  External Attachment:    Type:   Image     Comment:   External Document

## 2010-06-25 NOTE — Assessment & Plan Note (Signed)
Summary: post hosp [facility] per phone call/tg   Visit Type:  Follow-up Primary Provider:  Dr. Dwana Melena   History of Present Illness: Mr. Cutler is a pleasant 77 y/o CM with known history of CAD, hypercholesterolemia, with recent labs completed in Dec of 2011, who is a patient of Dr. Diona Browner.  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  EMS was called and patient states that he was told he may be having a heart attack. He underwent a cardiac catherization and was told that "everything is fine."  [We unfortunately do not have records from [place], depsite a number of attempts to have them sent to us.]     Mr. Brule states that they also found is Hgb to be 7.4 and he was transfused 2 units of PRB's.  He had a CT scan of his head and abdomen to evaluate for bleeding.  He was told he did not have any evidence of this.  He was released last week and has been feeling well since that time.  I am uncertain if he has had anemia work-up during hospitalization.    Recent lab work completed on 04/15/2010 demonstrated Hgb 11.0 and Hct 33.6.  Plt's 222.  He is and was without complaints of chest pain, or shortness of breath. He was having diaphoresis and weakness during the episode prior to his admission to [facility].  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 He states in both instances he was on his feet for   one hour to play the set.  He has not gone back to playing in the band yet.  He has had not further episodes.  Current Medications (verified): 1)  Lisinopril 10 Mg Tabs (Lisinopril) .... Take 1 Tablet By Mouth Once Daily 2)  Aspir-Trin 325 Mg Tbec (Aspirin) .... Take 1 Tab Daily 3)  Toprol Xl 25 Mg Xr24h-Tab (Metoprolol  Succinate) .... Take 1 Tab Daily 4)  Pepcid 20 Mg Tabs (Famotidine) .... Take 1 Tab Two Times A Day 5)  Plavix 75 Mg Tabs (Clopidogrel Bisulfate) .... Take 1 Tab Daily 6)  Nitrostat 0.4 Mg Subl (Nitroglycerin) .... Take As Directed For Chest Pain Not To Exceed 3 7)  Simvastatin 40 Mg Tabs (Simvastatin) .... Take 1 Tab Daily 8)  Clindamycin Hcl 300 Mg Caps (Clindamycin Hcl) .... Take 1 Tab Three Times A Day  Allergies (verified): 1)  ! Codeine  Comments:  Nurse/Medical Assistant: patient brought med list we reviewed patient uses [facility]  Past History:  Past medical, surgical, family and social histories (including risk factors) reviewed, and no changes noted (except as noted below).  Past Medical History: Reviewed history from 09/26/2009 and no changes required. Very mild aortic stenosis, possibly bicuspid valve with moderate calcification, 3/11 Hypertension CAD - multivessel, DES circumflex 5/11, 50% LAD, occluded RCA, LVEF 60%  Past Surgical History: Reviewed history from 08/07/2009 and no changes required. Bilateral cataract surgery  Family History: Reviewed history from 08/07/2009 and no changes required. Noncontributory for premature cardiovascular disease Siblings: Brother with pacemaker at age 68  Social History: Reviewed history from 08/07/2009 and no changes required. Retired - previously worked with Secretary/administrator Tobacco Use -  Former (quit 1990) Alcohol Use - no  Review of Systems       All other systems have been reviewed and are negative unless stated above.   Vital Signs:  Patient profile:   77 year old male Weight:      161 pounds BMI:     23.18 O2 Sat:      90 % on Room air Pulse rate:   69 / minute BP sitting:   149 / 60  (left arm)  Vitals Entered By: Dreama Saa, CNA (June 17, 2010 1:03 PM)  O2 Flow:  Room air  Physical Exam  General:  Well developed, well nourished, in no acute distress. Lungs:  Clear bilaterally  to auscultation and percussion. Heart:  Non-displaced PMI, chest non-tender; regular rate and rhythm, S1, S2 1/6 systolic  murmur, No rubs or gallops. Carotid upstroke normal,positive for L bruit. Normal abdominal aortic size, no bruits. Femorals normal pulses, no bruits. Pedals normal pulses. No edema, no varicosities. Abdomen:  Bowel sounds positive; abdomen soft and non-tender without masses, organomegaly, or hernias noted. No hepatosplenomegaly. Msk:  Back normal, normal gait. Muscle strength and tone normal. Extremities:  Right arm is very ecchymotic from prior IV insertions with mild swelling noted still. He is on clindimycin course of abx prescribed by [facility] physicians.  He states it continues to be sore. Neurologic:  Alert and oriented x 3. Psych:  Normal affect.   EKG  Procedure date:  06/17/2010  Findings:      Sinus bradycardia with rate of: 58 bpm  First degree AV-Block noted.    Impression & Recommendations:  Problem # 1:  SYNCOPE, HX OF (ICD-V12.49) This episode is of uncertain etiology.  It may have been from profound anemia but I do not have actual records to evaluate for this. He did receive blood transfusions during is recent hospitalization.  No evidence of bleeding was found per pateint.  No idea if he had anemia work-up. Awaiting records for better understanding concerning his hospitalization. Will keep on plavix for now. Review of labs drawn 06/16/2010 demonstrate Hgb 11.0.  No indicies are on this report. I have taken the HCTZ portion of the lisinopril off of his medication regimine, to avoid dehydration or vagal episodes, if in fact this is part of the episode.   Problem # 2:  CORONARY ATHEROSCLEROSIS NATIVE CORONARY ARTERY (ICD-414.01) Patient states he had cardiac catherization in the setting of presumed MI.  He states he was told the heart was okay. Again, awating records for this procedure. His updated medication list for this problem includes:    Lisinopril 10  Mg Tabs (Lisinopril) .Marland Kitchen... Take 1 tablet by mouth once daily    Aspir-trin 325 Mg Tbec (Aspirin) .Marland Kitchen... Take 1 tab daily    Toprol Xl 25 Mg Xr24h-tab (Metoprolol succinate) .Marland Kitchen... Take 1 tab daily    Plavix 75 Mg Tabs (Clopidogrel bisulfate) .Marland Kitchen... Take 1 tab daily    Nitrostat 0.4 Mg Subl (Nitroglycerin) .Marland Kitchen... Take as directed for chest pain not to exceed 3  Patient Instructions: 1)  Your physician recommends that you schedule a follow-up appointment in: 1 month 2)  Your physician has recommended you make the following change in your medication: stop taking Lisinopril/HCTZ and start taking Lisinopril 10mg   Prescriptions: LISINOPRIL 10 MG TABS (LISINOPRIL) take 1 tablet by mouth once daily  #30 x 3   Entered by:   Larita Fife Via LPN   Authorized by:   Joni Reining, NP   Signed by:  Larita Fife Via LPN on 16/02/9603   Method used:   Electronically to        [facility] (retail)       [address]       [place], [place]  [address]       Ph: [phone]       Fax: [phone]   RxID:   [id number]   Appended Document: Medical records-[name] Review of record just received from [facility] did show cardiac catherization completed on02/08/2010.  Will add to scanned reports.  Showed normal LV fx, 55-60%.  Moderate obstructice disease in the LAD, no instent restenosis of the midcircumflex, diffuse disease in the RCA with subtotal stenosis no different from cath report in May of 2011. There was a RV branch with 95% stenosis.  The cardiac enzymes were negative.  The cath was completed secondary to EKG elevation in II, III and  AVf.Marland KitchenHe was given thrombolytic therapy prior to the cath and ST segments normalzed.  He had a subsequent CT of the abdomen to rule out retroperiotoneal bleed in the setting of  Hgb of 7.4 post catherization and thrombolytic therapy.It was negative.  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 He was given 2 units of PRBC;s.   Discharge Hgb was Hgb 10.8 Hct 31.1  He was also seen by infectious diseases as he had an infection at the site of the right radial artery.  He was treated for right arm cellulitis.He is being treated with clindimycin.  These records are being placed in scanned portion of the chart.

## 2010-07-09 ENCOUNTER — Encounter: Payer: Self-pay | Admitting: Cardiology

## 2010-07-22 LAB — CBC
HCT: 25.7 % — ABNORMAL LOW (ref 39.0–52.0)
HCT: 29.3 % — ABNORMAL LOW (ref 39.0–52.0)
Hemoglobin: 10.1 g/dL — ABNORMAL LOW (ref 13.0–17.0)
Hemoglobin: 9 g/dL — ABNORMAL LOW (ref 13.0–17.0)
Hemoglobin: 9.6 g/dL — ABNORMAL LOW (ref 13.0–17.0)
MCHC: 34.7 g/dL (ref 30.0–36.0)
MCHC: 35.5 g/dL (ref 30.0–36.0)
MCV: 98.3 fL (ref 78.0–100.0)
Platelets: 128 10*3/uL — ABNORMAL LOW (ref 150–400)
Platelets: 104 10*3/uL — ABNORMAL LOW (ref 150–400)
RBC: 2.95 MIL/uL — ABNORMAL LOW (ref 4.22–5.81)
RDW: 14.4 % (ref 11.5–15.5)
RDW: 14.2 % (ref 11.5–15.5)
WBC: 5.4 10*3/uL (ref 4.0–10.5)

## 2010-07-22 LAB — BASIC METABOLIC PANEL
BUN: 14 mg/dL (ref 6–23)
BUN: 15 mg/dL (ref 6–23)
Calcium: 8.4 mg/dL (ref 8.4–10.5)
Chloride: 105 mEq/L (ref 96–112)
Creatinine, Ser: 1.25 mg/dL (ref 0.4–1.5)
GFR calc Af Amer: 57 mL/min — ABNORMAL LOW (ref >60)
GFR calc non Af Amer: 56 mL/min — ABNORMAL LOW (ref >60)
GFR calc non Af Amer: 59 mL/min — ABNORMAL LOW (ref >60)
GFR calc non Af Amer: 47 mL/min — ABNORMAL LOW (ref >60)
Glucose, Bld: 115 mg/dL — ABNORMAL HIGH (ref 70–99)
Glucose, Bld: 130 mg/dL — ABNORMAL HIGH (ref 70–99)
Potassium: 3.7 mEq/L (ref 3.5–5.1)
Potassium: 3.9 mEq/L (ref 3.5–5.1)
Potassium: 3.6 mEq/L (ref 3.5–5.1)
Sodium: 135 mEq/L (ref 135–145)
Sodium: 135 mEq/L (ref 135–145)

## 2010-07-22 LAB — CARDIAC PANEL(CRET KIN+CKTOT+MB+TROPI)
CK, MB: 1.7 ng/mL (ref 0.3–4.0)
Relative Index: INVALID (ref 0.0–2.5)
Relative Index: INVALID (ref 0.0–2.5)
Total CK: 49 U/L (ref 7–232)
Total CK: 57 U/L (ref 7–232)
Troponin I: 0.03 ng/mL (ref 0.00–0.06)

## 2010-07-22 LAB — APTT: aPTT: 30 seconds (ref 24–37)

## 2010-07-22 LAB — PROTIME-INR: INR: 1.02 (ref 0.00–1.49)

## 2010-07-25 ENCOUNTER — Ambulatory Visit (INDEPENDENT_AMBULATORY_CARE_PROVIDER_SITE_OTHER): Payer: Medicare Other | Admitting: Cardiology

## 2010-07-25 ENCOUNTER — Encounter: Payer: Self-pay | Admitting: Cardiology

## 2010-07-25 DIAGNOSIS — I2510 Atherosclerotic heart disease of native coronary artery without angina pectoris: Secondary | ICD-10-CM

## 2010-07-25 DIAGNOSIS — D649 Anemia, unspecified: Secondary | ICD-10-CM

## 2010-07-25 DIAGNOSIS — D62 Acute posthemorrhagic anemia: Secondary | ICD-10-CM

## 2010-07-25 DIAGNOSIS — I10 Essential (primary) hypertension: Secondary | ICD-10-CM

## 2010-07-25 DIAGNOSIS — I359 Nonrheumatic aortic valve disorder, unspecified: Secondary | ICD-10-CM

## 2010-07-25 DIAGNOSIS — E782 Mixed hyperlipidemia: Secondary | ICD-10-CM

## 2010-07-25 MED ORDER — ISOSORBIDE MONONITRATE ER 30 MG PO TB24: 30.0000 mg | ORAL_TABLET | Freq: Every day | ORAL | Status: DC

## 2010-07-25 NOTE — Assessment & Plan Note (Signed)
Will begin a trial of Imdur 30 mg daily in addition to his present regimen, to see if this helps with any symptomatology control.

## 2010-07-25 NOTE — Assessment & Plan Note (Signed)
Noted following cardiac catheterization in [name] back in February. Hemoglobin increased with packed red cell transfusions, and CT scan of the abdomen and pelvis reportedly demonstrated no acute bleeding source. He denies any active melena or hematochezia. I would like to follow up with a CBC in light of his persistent weakness. He is not aware of any recent bleeding.

## 2010-07-25 NOTE — Patient Instructions (Signed)
**Note De-Identified Flay Ghosh Obfuscation** Your physician recommends that you schedule a follow-up appointment in: 6 months Your physician recommends that you return for lab work in: today

## 2010-07-25 NOTE — Assessment & Plan Note (Signed)
Continue observation.

## 2010-07-25 NOTE — Progress Notes (Signed)
Clinical Summary Brian [name] is a 77 y.o.male presenting for routine followup. He was seen in February by Ms. Lawrence.   Records from [place] were reviewed previously. He did undergo a cardiac catheterization in [place] on 4 February demonstrating LVEF 50-55%, moderate disease within the LAD, no significant in-stent restenosis within the circumflex, subtotal stenosis of the RCA with RV marginal branch disease. Cardiac markers were normal. He was apparently treated with thrombolytic therapy prior to this procedure related to inferior ST segment changes. Hemoglobin dropped down to 7.4 after catheterization resulting in a CT scan of the abdomen that demonstrated no obvious bleed. He did have an incidentally noted abdominal aortic aneurysm measuring 3.8 cm. 2 units of packed cells cells were given with subsequent hemoglobin of 10.8.  He states he has been slow to recover his energy, generally feeling weak. This recent week he had trouble with nausea and anorexia, states he lost approximately 8 pounds, but is beginning to eat more regularly. No reported melena or hematochezia. No abdominal pain or diarrhea.  I reviewed his medications. We discussed his recent cardiac catheterization report. He still may be having some angina related to the chronic RCA occlusion, however there was some collateralization noted.   Allergies  Allergen Reactions  . Codeine     REACTION: nausea    Current outpatient prescriptions:aspirin 325 MG tablet, Take 325 mg by mouth daily.  , Disp: , Rfl: ;  clopidogrel (PLAVIX) 75 MG tablet, Take 75 mg by mouth daily.  , Disp: , Rfl: ;  famotidine (PEPCID) 20 MG tablet, Take 20 mg by mouth 2 (two) times daily.  , Disp: , Rfl: ;  lisinopril (PRINIVIL,ZESTRIL) 10 MG tablet, Take 10 mg by mouth daily.  , Disp: , Rfl:  metoprolol succinate (TOPROL-XL) 25 MG 24 hr tablet, Take 25 mg by mouth daily.  , Disp: , Rfl: ;  nitroGLYCERIN (NITROSTAT) 0.4 MG SL tablet, Place 0.4 mg under the  tongue every 5 (five) minutes as needed. TAKE AS DIRECTED FOR CHEST PAIN, NOT TO EXCEED 3 DOSES. , Disp: , Rfl: ;  simvastatin (ZOCOR) 40 MG tablet, Take 40 mg by mouth at bedtime.  , Disp: , Rfl:  DISCONTD: clindamycin (CLEOCIN) 300 MG capsule, Take 300 mg by mouth 3 (three) times daily.  , Disp: , Rfl:   Past Medical History  Diagnosis Date  . Aortic stenosis 3/11    Very mild, possibly bicuspid valve  . Coronary artery disease 5/11    Multivessel, DES circumflex 5/11, 50% LAD, occluded RCA, LVEF 60%  . Essential hypertension, benign     Social History Mr. Morandi reports that he quit smoking about 22 years ago. He has never used smokeless tobacco. Mr. Elvin reports that he does not drink alcohol.  Review of Systems No fevers, chills. No chest pain, progressive shortness of breath, cough, hemoptysis, or wheezing. No palpitations, dizziness, or syncope. No dysphasia or odynophagia. No orthopnea, PND, or lower extremity edema. No focal motor weakness, memory problems, or speech deficits. Otherwise systems reviewed and negative except as already outlined.   Physical Examination Filed Vitals:   07/25/10 1425  BP: 140/54  Pulse: 67   Thin elderly male in no acute distress.   HEENT: Conjunctiva and lids normal, oropharynx with moist mucosa.   Neck: Supple, no elevated JVP or loud bruits, no thyromegaly.   Lungs: Clear to auscultation, nonlabored.   Cardiac: Regular rate and rhythm, 2/6 systolic murmur heard at the base, second heart sound preserved, no S3  or pericardial  rub.   Abdomen: Soft, nontender, bowel sounds present.   Skin: Warm and dry.   Extremities: No pitting edema, distal pulses are 2+.   Musculoskeletal: No gross deformities.   Neuropsychiatric: Alert and oriented x3, affect appropriate.   Problem List and Plan

## 2010-07-25 NOTE — Assessment & Plan Note (Signed)
Continue present medications. 

## 2010-07-26 LAB — CBC WITH DIFFERENTIAL/PLATELET
Eosinophils Absolute: 0.1 10*3/uL (ref 0.0–0.7)
Hemoglobin: 9.5 g/dL — ABNORMAL LOW (ref 13.0–17.0)
Lymphs Abs: 1.7 10*3/uL (ref 0.7–4.0)
MCH: 32.1 pg (ref 26.0–34.0)
Monocytes Relative: 9 % (ref 3–12)
Neutro Abs: 4.3 10*3/uL (ref 1.7–7.7)
Neutrophils Relative %: 64 % (ref 43–77)
Platelets: 185 10*3/uL (ref 150–400)
RBC: 2.96 MIL/uL — ABNORMAL LOW (ref 4.22–5.81)
WBC: 6.8 10*3/uL (ref 4.0–10.5)

## 2010-08-15 ENCOUNTER — Ambulatory Visit: Payer: Medicare Other | Admitting: Adult Health

## 2010-09-22 ENCOUNTER — Ambulatory Visit (INDEPENDENT_AMBULATORY_CARE_PROVIDER_SITE_OTHER): Payer: Medicare Other | Admitting: Internal Medicine

## 2010-09-22 DIAGNOSIS — K219 Gastro-esophageal reflux disease without esophagitis: Secondary | ICD-10-CM

## 2010-09-22 DIAGNOSIS — K921 Melena: Secondary | ICD-10-CM

## 2010-09-22 DIAGNOSIS — D509 Iron deficiency anemia, unspecified: Secondary | ICD-10-CM

## 2010-09-23 ENCOUNTER — Telehealth: Payer: Self-pay | Admitting: Cardiology

## 2010-09-23 NOTE — Telephone Encounter (Signed)
Brian [name] is status post drug-eluting stent placement to the circumflex in May of 2011. He should be able to temporarily interrupt aspirin and Plavix at this point for colonoscopy.

## 2010-09-25 ENCOUNTER — Other Ambulatory Visit: Payer: Self-pay | Admitting: *Deleted

## 2010-09-25 MED ORDER — METOPROLOL SUCCINATE ER 25 MG PO TB24: 25.0000 mg | ORAL_TABLET | Freq: Every day | ORAL | Status: DC

## 2010-10-03 ENCOUNTER — Other Ambulatory Visit (INDEPENDENT_AMBULATORY_CARE_PROVIDER_SITE_OTHER): Payer: Self-pay | Admitting: Internal Medicine

## 2010-10-03 ENCOUNTER — Encounter (HOSPITAL_BASED_OUTPATIENT_CLINIC_OR_DEPARTMENT_OTHER): Payer: Medicare Other | Admitting: Internal Medicine

## 2010-10-03 ENCOUNTER — Ambulatory Visit (HOSPITAL_COMMUNITY)
Admission: RE | Admit: 2010-10-03 | Discharge: 2010-10-03 | Disposition: A | Discharge status: Home / Self Care | Payer: Medicare Other | Source: Ambulatory Visit | Attending: Internal Medicine | Admitting: Internal Medicine

## 2010-10-03 DIAGNOSIS — D509 Iron deficiency anemia, unspecified: Secondary | ICD-10-CM | POA: Insufficient documentation

## 2010-10-03 DIAGNOSIS — K5730 Diverticulosis of large intestine without perforation or abscess without bleeding: Secondary | ICD-10-CM | POA: Insufficient documentation

## 2010-10-03 DIAGNOSIS — D126 Benign neoplasm of colon, unspecified: Secondary | ICD-10-CM

## 2010-10-03 DIAGNOSIS — K2270 Barrett's esophagus without dysplasia: Secondary | ICD-10-CM

## 2010-10-03 DIAGNOSIS — K921 Melena: Secondary | ICD-10-CM

## 2010-10-03 DIAGNOSIS — K219 Gastro-esophageal reflux disease without esophagitis: Secondary | ICD-10-CM

## 2010-10-03 DIAGNOSIS — E785 Hyperlipidemia, unspecified: Secondary | ICD-10-CM | POA: Insufficient documentation

## 2010-10-03 DIAGNOSIS — K2990 Gastroduodenitis, unspecified, without bleeding: Secondary | ICD-10-CM

## 2010-10-06 ENCOUNTER — Encounter: Payer: Self-pay | Admitting: Cardiology

## 2010-10-06 ENCOUNTER — Other Ambulatory Visit: Payer: Self-pay | Admitting: Cardiology

## 2010-10-06 LAB — H. PYLORI ANTIBODY, IGG: H Pylori IgG: 1.37 {ISR} — ABNORMAL HIGH

## 2010-10-06 LAB — LIPID PANEL
Cholesterol: 133 mg/dL (ref 0–200)
HDL: 29 mg/dL — ABNORMAL LOW (ref >39)

## 2010-10-06 LAB — RENAL FUNCTION PANEL
Albumin: 4.3 g/dL (ref 3.5–5.2)
BUN: 29 mg/dL — ABNORMAL HIGH (ref 6–23)
CO2: 26 mEq/L (ref 19–32)
Glucose, Bld: 103 mg/dL — ABNORMAL HIGH (ref 70–99)
Potassium: 4.2 mEq/L (ref 3.5–5.3)
Sodium: 139 mEq/L (ref 135–145)

## 2010-10-07 ENCOUNTER — Ambulatory Visit: Payer: Medicare Other | Admitting: Cardiology

## 2010-10-09 ENCOUNTER — Encounter: Payer: Self-pay | Admitting: Cardiology

## 2010-10-09 ENCOUNTER — Ambulatory Visit (INDEPENDENT_AMBULATORY_CARE_PROVIDER_SITE_OTHER): Payer: Medicare Other | Admitting: Cardiology

## 2010-10-09 VITALS — BP 146/62 | HR 60 | Ht 70.0 in | Wt 153.0 lb

## 2010-10-09 DIAGNOSIS — I2510 Atherosclerotic heart disease of native coronary artery without angina pectoris: Secondary | ICD-10-CM

## 2010-10-09 DIAGNOSIS — D62 Acute posthemorrhagic anemia: Secondary | ICD-10-CM

## 2010-10-09 DIAGNOSIS — I359 Nonrheumatic aortic valve disorder, unspecified: Secondary | ICD-10-CM

## 2010-10-09 DIAGNOSIS — I10 Essential (primary) hypertension: Secondary | ICD-10-CM

## 2010-10-09 NOTE — Assessment & Plan Note (Signed)
Patient recently underwent evaluation by Dr. Karilyn Cota. EGD and colonoscopy findings noted above. He continues on Pepcid.

## 2010-10-09 NOTE — Assessment & Plan Note (Signed)
Continue to monitor blood pressure. No changes made to present regimen.

## 2010-10-09 NOTE — Assessment & Plan Note (Signed)
Bicuspid aortic valve with mild stenosis.

## 2010-10-09 NOTE — Patient Instructions (Signed)
Your physician recommends that you schedule a follow-up appointment in: 6 months  

## 2010-10-09 NOTE — Progress Notes (Signed)
Clinical Summary Mr. Maners is a 77 y.o.male presenting for followup. He was seen back in March. At that time Imdur was added for angina suppression. Followup CBC at that time also showed continued anemia, he was ultimately referred to see Dr. Karilyn Cota. He underwent EGD and colonoscopy earlier this month. He had evidence of a short segment of Barrett's esophagus, duodenitis, small cecal polyp, and few diverticula in the ascending colon and hepatic flexure. Continued antacids were recommended. He was off Plavix temporarily for the procedures.  He did undergo a cardiac catheterization in [place] on 4 February demonstrating LVEF 50-55%, moderate disease within the LAD, no significant in-stent restenosis within the circumflex, subtotal stenosis of the RCA with RV marginal branch disease.  He reports no significant recurrent chest pain, no progressive shortness of breath. 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 He reports compliance with medications. No active melena or hematochezia.    Allergies  Allergen Reactions  . Codeine     REACTION: nausea    Current outpatient prescriptions:aspirin 325 MG tablet, Take 325 mg by mouth daily.  , Disp: , Rfl: ;  clopidogrel (PLAVIX) 75 MG tablet, Take 75 mg by mouth daily.  , Disp: , Rfl: ;  famotidine (PEPCID) 20 MG tablet, Take 20 mg by mouth 2 (two) times daily.  , Disp: , Rfl: ;  isosorbide mononitrate (IMDUR) 30 MG 24 hr tablet, Take 1 tablet (30 mg total) by mouth daily., Disp: 30 tablet, Rfl: 11 lisinopril (PRINIVIL,ZESTRIL) 10 MG tablet, Take 10 mg by mouth daily.  , Disp: , Rfl: ;  metoprolol succinate (TOPROL-XL) 25 MG 24 hr tablet, Take 1 tablet (25 mg total) by mouth daily., Disp: 30 tablet, Rfl: 6;  nitroGLYCERIN (NITROSTAT) 0.4 MG SL tablet, Place 0.4 mg under the tongue every 5 (five) minutes as needed. TAKE AS DIRECTED FOR CHEST PAIN, NOT TO EXCEED 3 DOSES. , Disp: , Rfl:  simvastatin (ZOCOR) 40 MG tablet, Take 40 mg by mouth at  bedtime.  , Disp: , Rfl: ;  DISCONTD: indomethacin (INDOCIN) 50 MG capsule, , Disp: , Rfl:   Past Medical History  Diagnosis Date  . Aortic stenosis 3/11    Very mild, possibly bicuspid valve  . Coronary atherosclerosis of native coronary artery 5/11    Multivessel, DES circumflex 5/11, 50% LAD, occluded RCA, LVEF 60%  . Essential hypertension, benign     Social History Mr. Eifler reports that he quit smoking about 22 years ago. His smoking use included Cigarettes. He has a 50 pack-year smoking history. He has never used smokeless tobacco. Mr. Peral reports that he does not drink alcohol.  Review of Systems No palpitations or syncope. Occasional dizziness when standing. Otherwise reviewed and negative.  Physical Examination Filed Vitals:   10/09/10 1401  BP: 146/62  Pulse: 60   Thin elderly male in no acute distress.  HEENT: Conjunctiva and lids normal, oropharynx with moist mucosa.  Neck: Supple, no elevated JVP or loud bruits, no thyromegaly.  Lungs: Clear to auscultation, nonlabored.  Cardiac: Regular rate and rhythm, 2/6 systolic murmur heard at the base, second heart sound preserved, no S3 or pericardial  rub.  Abdomen: Soft, nontender, bowel sounds present.  Skin: Warm and dry.  Extremities: No pitting edema, distal pulses are 2+.  Musculoskeletal: No gross deformities.  Neuropsychiatric: Alert and oriented x3, affect appropriate.   Problem List and Plan

## 2010-10-09 NOTE — Assessment & Plan Note (Signed)
Clinically stable at this point. Plan to continue present regimen.

## 2010-10-13 NOTE — Op Note (Signed)
NAME:  Brian [name], Brian [name]             ACCOUNT NO.:  [id number]  MEDICAL RECORD NO.:  [id number]           PATIENT TYPE:  O  LOCATION:  [facility]                          FACILITY:  [facility]  PHYSICIAN:  Lionel December, M.D.    DATE OF BIRTH:  Oct 21, 1927  DATE OF PROCEDURE:  10/03/2010 DATE OF DISCHARGE:                              OPERATIVE REPORT   PROCEDURE:  Esophagogastroduodenoscopy followed by colonoscopy.  INDICATION:  Brian [name] is an 77 year old Caucasian male was recently found to be anemic and required transfusion.  He is noted to have heme- positive stools.  He has chronic GERD.  He has had symptoms for 40 years.  He is currently on antireflux measures and Pepcid and he states his heartburn is well controlled.  His upper GI tract has never been evaluated.  During his workup, he was also found to have low B12 level and was seen by Dr. Catalina Pizza, for B12 shots and further therapy.  Both the procedures were reviewed with the patient.  Informed consent was obtained.  MEDICATIONS FOR CONSCIOUS SEDATION:  Cetacaine spray for pharyngeal topical anesthesia, Demerol 25 mg IV, and Versed 4 mg IV.  Atropine 0.25 mg IV for asymptomatic bradycardia.  FINDINGS:  Procedures performed in endoscopy suite.  The patient's vital signs and O2 sat were monitored during the procedure and remained stable.  PROCEDURE #1: 1. Esophagogastroduodenoscopy.  The patient was placed in the left     lateral position and Pentax videoscope was passed through     oropharynx without any difficulty into esophagus. 2. Esophagus.  Mucosa of the proximal middle third was normal.     Distally, there were 2 large patches of salmon-colored mucosa, felt     to be typical of short segment Barrett.  The one was 2 cm long, the     other one was 12-15 mm.  GE junction was estimated to be at 39 cm. 3. Stomach, it was emptied and distended very well insufflation.     Folds of proximal stomach are normal.  Examination of the  mucosa,     body, antrum, pyloric channel as well as angularis, fundus and     cardia was normal. 4. Duodenum.  There was focal erythema and scarring to the bulb along     the medial wall and noncritical narrowing distally.  Scope was     passed second part of duodenum where mucosa and folds were normal. 5. From the way out, biopsy was taken from distal esophageal mucosa to     confirm Barrett and also rule out dysplasia.  PROCEDURE #2:  Colonoscopy.  Rectal examination was performed.  No abnormality noted on external or digital exam.  Pentax videoscope was placed through rectum and advanced under vision into sigmoid colon and beyond.  Preparation was satisfactory.  Few diverticula noted in the region of hepatic flexure ascending colon.  Scope was passed into cecum which was identified by appendiceal orifice and ileocecal valve. Pictures were taken for the record.  There was a 3-mm polyp at cecum which was ablated via cold biopsy.  As the scope was withdrawn, colonic mucosa was carefully  examined and no other mucosal abnormalities were noted.  Rectal mucosa was normal.  Scope was retroflexed to examine anorectal which was unremarkable.  Endoscope was withdrawn.  Withdrawal time was 10 minutes.  The patient tolerated theses procedures well.  FINAL DIAGNOSES: 1. Short segment Barrett maximal length 2 cm.  Biopsy taken for     histologic confirmation to rule out dysplasia. 2. Duodenitis with a scar and noncritical distal bulbar narrowing. 3. A 3-mm polyp ablated via cold biopsy from the cecum. 4. Few diverticula at the ascending colon/hepatic flexure.  RECOMMENDATIONS: 1. He will continue antireflux measures and his Pepcid as before. 2. He can resume his Plavix later today. 3. We will check his H. pylori serology today.  I will be contacting     the patient results of blood test and biopsy.          ______________________________ Lionel December, M.D.     NR/MEDQ  D:  10/03/2010   T:  10/03/2010  Job:  [id number]  cc:   Catalina Pizza, M.D. Fax: [phone]  Electronically Signed by Lionel December M.D. on 10/13/2010 11:23:23 AM

## 2010-10-13 NOTE — Consult Note (Signed)
NAME:  Brian [name], Brian [name]             ACCOUNT NO.:  [id number]  MEDICAL RECORD NO.:                     PATIENT TYPE:  LOCATION:                                 FACILITY:  PHYSICIAN:  Lionel December, M.D.    DATE OF BIRTH:  [birth year]  DATE OF CONSULTATION: DATE OF DISCHARGE:                                CONSULTATION   REASON FOR CONSULTATION:  Anemia and heme-positive stools.  HISTORY OF PRESENT ILLNESS:  Brian [name] is an 77 year old Caucasian male who is referred through courtesy of Dr. Dwana Melena, for GI evaluation.  He states he had a syncopal episode about a year ago but decided not to pursue it any further.  In first part of February this year, he had an episode where he felt warm, diaphoretic, experienced nausea and thought he was going to pass out.  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  He recalls that he had his usual meals and uneventful day.  Does not recall that he had shortness of breath or chest pain.  He was admitted to [facility]. He apparently was given either heparin alone or Lovenox.  His hemoglobin on admission was around 11 g.  He had a cardiac cath and his stent was noted to be patent.  While in the hospital, he developed a size of hematoma or bruise size of great foot over his left arm.  He also experienced nose bleed.  His hemoglobin dropped to 7.5 g and he was given 2 units of PRBCs.  He does not recall that he had Hemoccult while he was in the hospital.  He was seen by Dr. Dwana Melena, his primary care physician.  First set of Hemoccult were negative.  The second set 1 out of 3 were positive.  He denies melena or rectal bleeding.  He states he has had heartburn since 1972 for 40 years.  Currently, he is on a Pepcid which has controlled his symptoms well.  He was on Prilosec in the past which he took few or several months.  He tells me Pepcid is given more relief than Prilosec.  He says his appetite is good.   He lost about 10 pounds during recent hospitalization, has gained 2 back.  He may have occasional hoarseness, but he denies sore throat, chronic cough, or need to clear his throat.  He states over the last 3-4 months, he has noted change in the caliber of his stool, although he has not experienced any abdominal pain.  His bowels generally move every 2-3 days.  He never felt that he is constipated.  He has never been screened for colorectal carcinoma and he is also never undergone EGD even though he has had symptoms of GERD for 40 years.  He says he used to have dysphagia, but since been on Pepcid, it has resolved.  CURRENT MEDICATIONS: 1. Metoprolol 25 mg p.o. daily. 2. Pepcid 20 mg p.o. b.i.d. 3. Asa 325 mg p.o. daily. 4. Lisinopril/HCT 20/12.5 daily. 5. Plavix 75 mg daily. 6. Simvastatin, dose unknown. 7. Isosorbide  30 mg p.o. daily.  PAST MEDICAL HISTORY:  He has had symptoms of GERD since 1972.  He has coronary artery disease.  He had coronary stenting in January 2011, at [facility] by Dr. Excell Seltzer.  Recent hospitalization for syncope, presyncope, and stent patent on cardiac cath (February 2010).  He says he has had mild exposure to asbestos.  He has had left cataract extraction in April 2008, and right one in 2004.  He has hyperlipidemia.  He has been on therapy for the last 6 months.  ALLERGIES:  To CODEINE causing nausea.  FAMILY HISTORY:  Mother lived to be 82.  She possibly had DVT and PE. Father lived to be 24.  He had 3 sisters and 3 brother and they are all deceased.  Sisters lived to be in their 67s.  One brother died at age 34, he had severe back problems.  One brother died at age 70 of heart problems, he had CABG.  SOCIAL HISTORY:  He is married for 45 years.  He has 5 children. Daughter has back problems and rest of children are in good health.  He works all in all for 60 years, but he is now retired.  He was Visual merchandiser.  He smoked cigarettes about a pack a  day, half to pack and half a day for 40 years, but quit 20 years ago and he never drank alcohol.  OBJECTIVE:  VITAL SIGNS:  Weight 159 pounds, he is 75 inches tall, pulse 78 per minute, blood pressure 150/80, temp is 97.7. HEENT:  Conjunctivae is pink.  Sclerae nonicteric.  Oropharyngeal mucosa is normal.  He has upper lower dentures in place.  No neck masses or thyromegaly noted. CARDIAC:  With regular rhythm.  Normal S1 and S2.  Faint systolic murmur noted at LLSB. LUNGS:  Clear to auscultation. ABDOMEN:  Symmetrical.  Bowel sounds are normal.  On palpation, he has soft abdomen without tenderness, organomegaly, or masses. RECTAL:  Deferred at deferred. EXTREMITIES:  No peripheral edema or clubbing noted.  LABORATORY DATA:  From August 04, 2010, WBC 4.4, H and H 9.4 and 29.9, MCV 98.4, platelet count 175,000.  Comprehensive chemistry panel normal except creatinine of 1.38.  GFR mL per minute.  Serum iron 66, TIBC 230, saturations 29%, and serum ferritin is 470.  ASSESSMENT:  Brian [name] is 77 year old Caucasian male who has anemia with heme-positive stools.  His iron studies do not show or confirm iron deficiency anemia.  He has not experienced any [place] GI bleed.  In February, he was admitted to [facility] with syncope, presyncope, and required 2 units of PRBCs for drop in hemoglobin from 11 g to 7.5.  He apparently had large ecchymosis over his right arm with heparin or Lovenox.  I am not convinced that this would explain the significant drop.  He certainly could have bled at that time and might have caused his acute symptoms and drop in his H and H.  He has had symptoms of GERD for 40 years.  He is currently on PPI with good control.  He is currently on full dose aspirin and Plavix and therefore at risk for injury to his GI tract whether that is upper, mid, or lower.  We need to rule out occult disease in the form of peptic ulcer disease, neoplasm, but he could  also have GI angiodysplasia.  RECOMMENDATIONS:  We will check his H and H today and also do a serum B12 level.  Diagnostic esophagogastroduodenoscopy followed by  colonoscopy to perform that at [facility] in near future.  If feasible likes to hold off his Plavix for few days, but we will check with Dr. Nona Dell first.  He can continue his asa in the meantime, however, he can continue his asa.  We appreciate the opportunity to participate in this care of gentleman.          ______________________________ Lionel December, M.D.     NR/MEDQ  D:  09/22/2010  T:  09/23/2010  Job:  [id number]  cc:   Catalina Pizza, M.D. Fax: [phone]  Jonelle Sidle, MD [phone] [address] [place], [place] [address]  Electronically Signed by Lionel December M.D. on 10/13/2010 11:22:50 AM

## 2010-11-29 IMAGING — CR DG CHEST 2V
2 series · 2 of 2 positions shown · non-contrast
Comparison: None

CLINICAL DATA: Abnormal stress test/short of breath

CHEST - 2 VIEW

[view not recorded (1 of 2)]
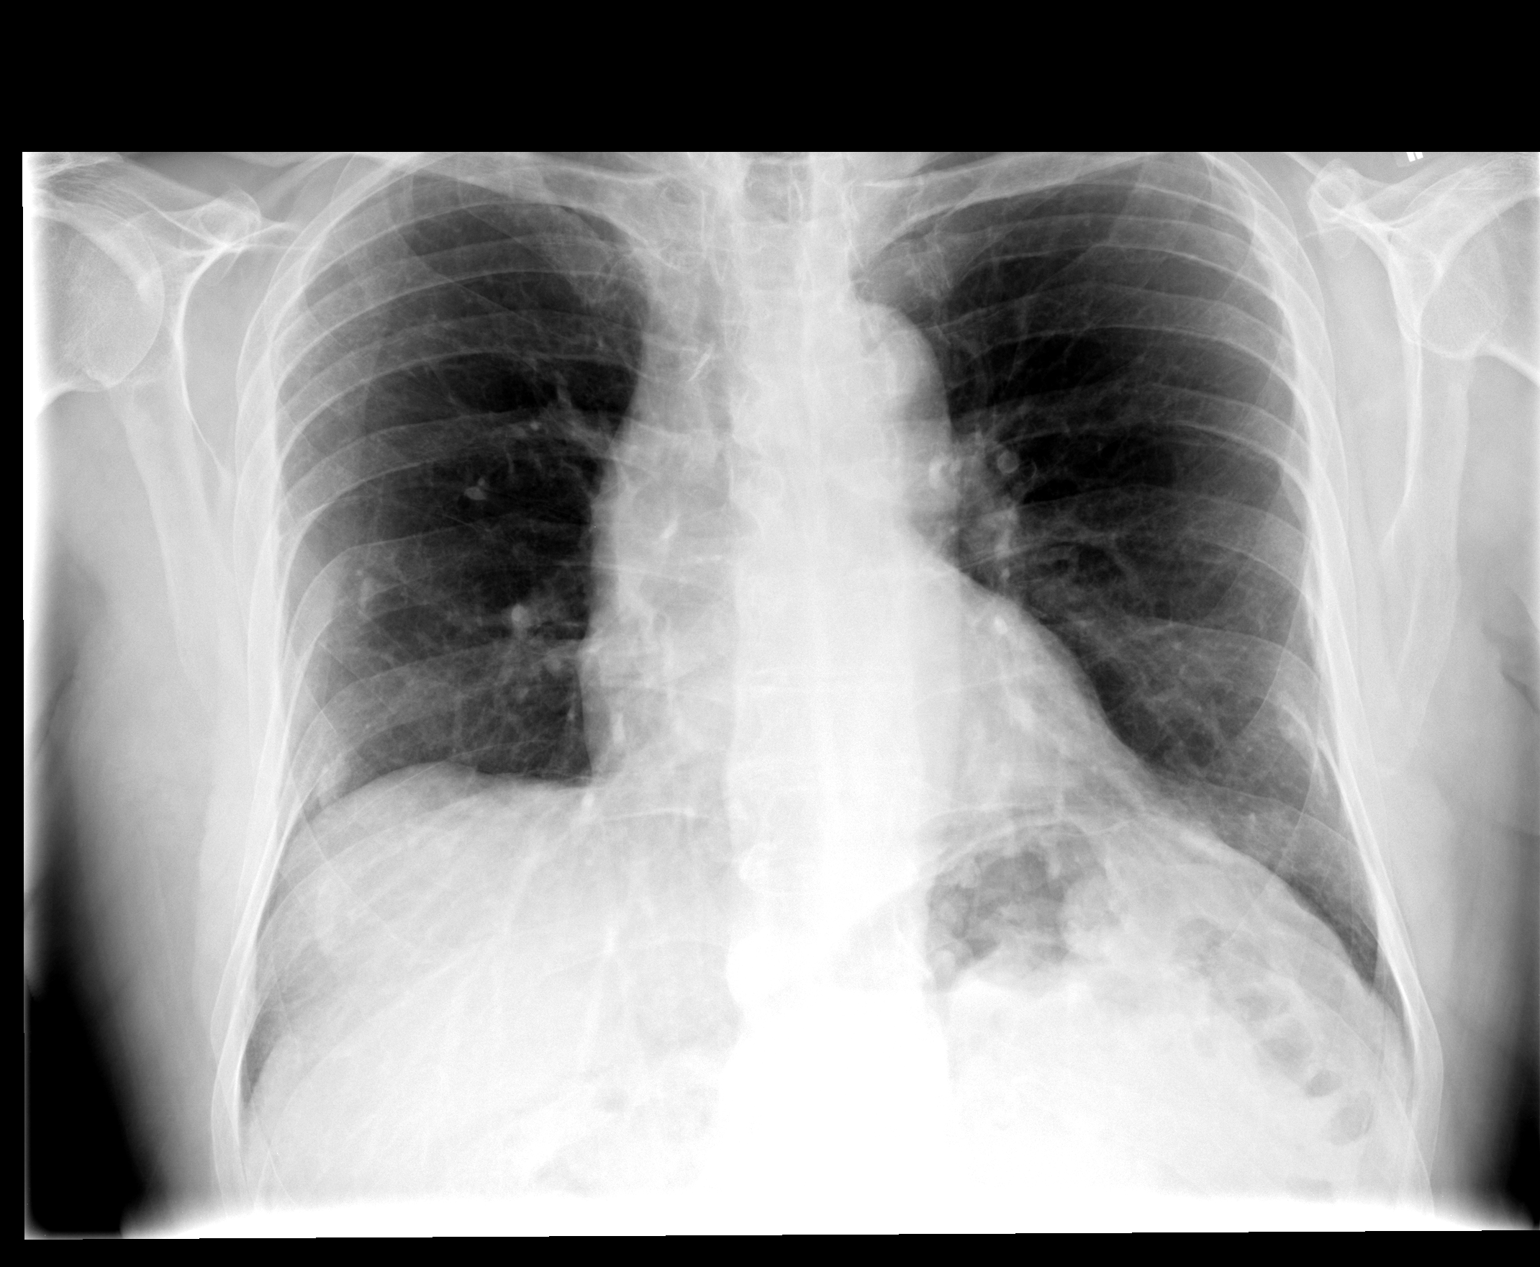

[view not recorded (2 of 2)]
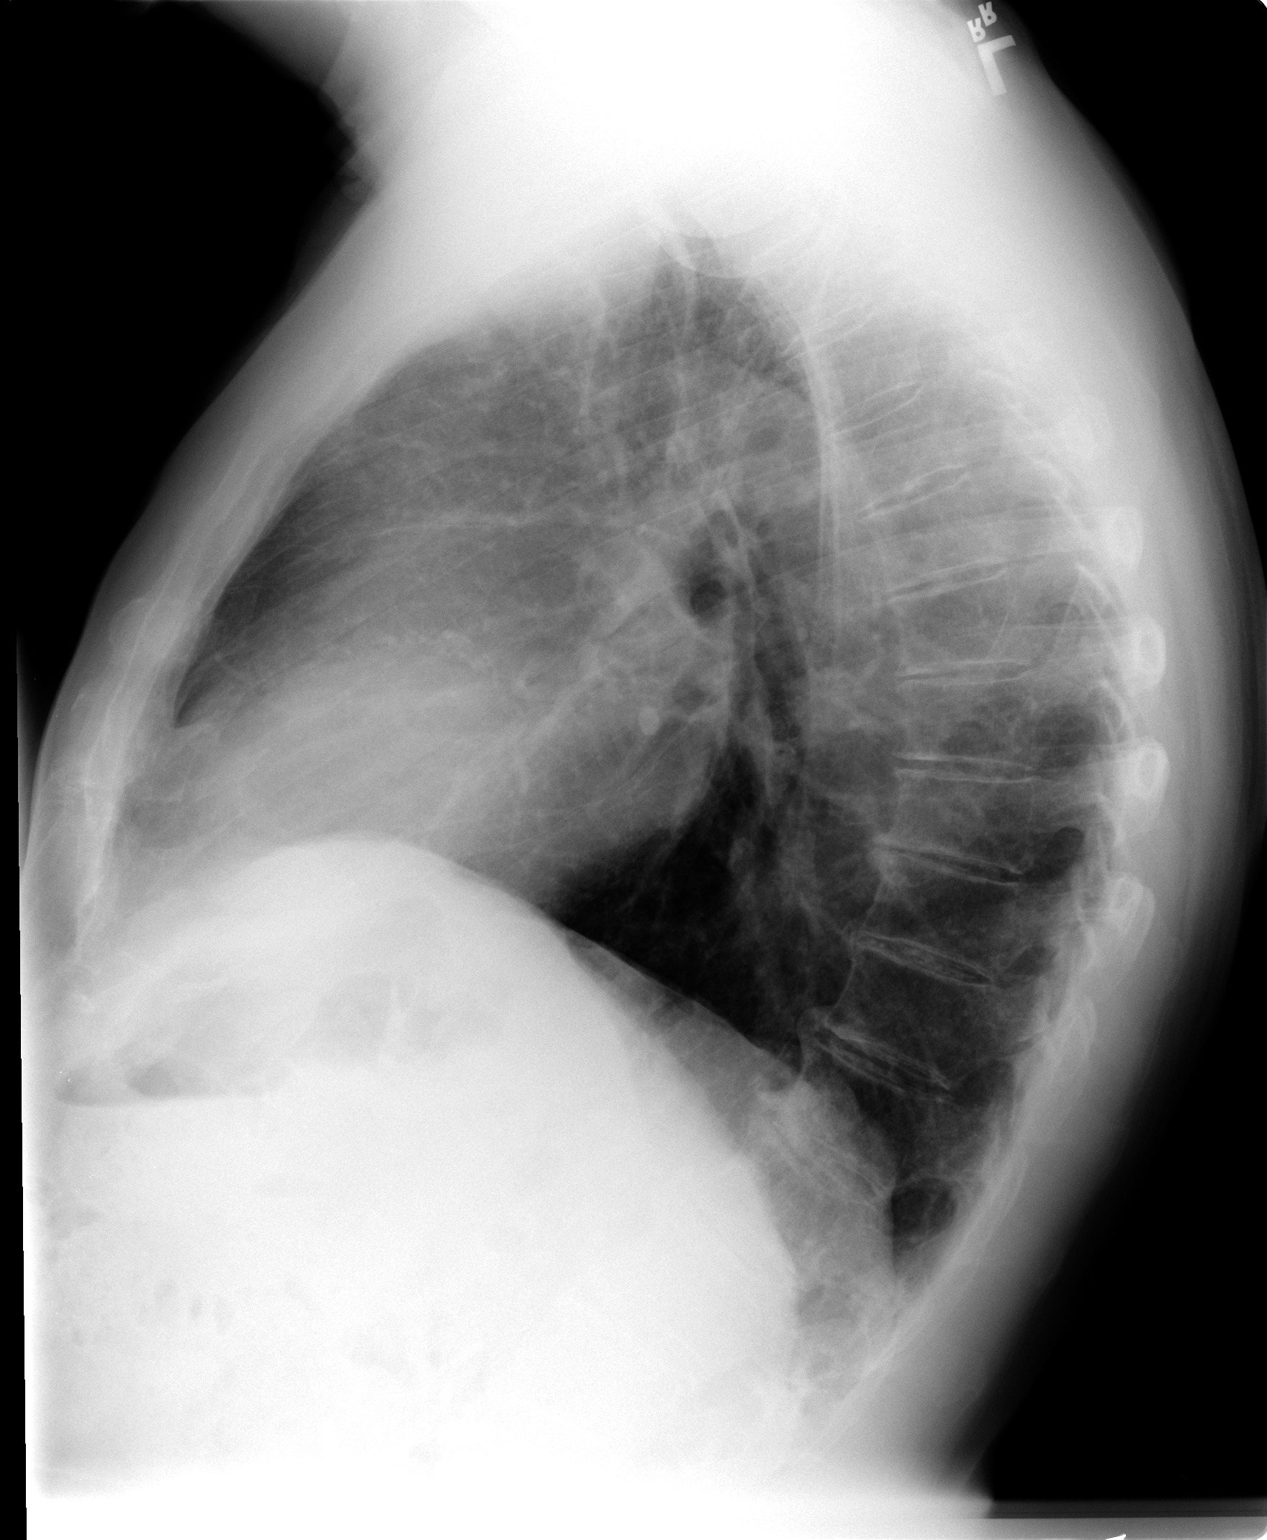

[2 of 2 positions shown; findings below may reference images not displayed]

FINDINGS: Heart size within normal limits.  Aorta mildly tortuous.
No congestive heart failure or pleural fluid.

No definite acute pulmonary disease.  However, there appear to be
pleural calcifications bilaterally projecting over the lower lung
zones.  If there are no prior films that we could obtain for
comparison, CT is warranted to rule out asbestosis or it's
complications.

Osseous structures intact.
IMPRESSION: Bilateral densities are likely pleural calcifications - see above
discussion.  Otherwise no active disease.

## 2010-12-04 ENCOUNTER — Telehealth (INDEPENDENT_AMBULATORY_CARE_PROVIDER_SITE_OTHER): Payer: Self-pay | Admitting: *Deleted

## 2010-12-04 DIAGNOSIS — D649 Anemia, unspecified: Secondary | ICD-10-CM

## 2010-12-04 NOTE — Telephone Encounter (Signed)
Lab is due August 20,20012 , letter sent to patient.

## 2010-12-05 LAB — HEMOGLOBIN AND HEMATOCRIT, BLOOD: HCT: 33.2 % — ABNORMAL LOW (ref 39.0–52.0)

## 2010-12-06 ENCOUNTER — Telehealth (INDEPENDENT_AMBULATORY_CARE_PROVIDER_SITE_OTHER): Payer: Self-pay | Admitting: Internal Medicine

## 2010-12-09 NOTE — Telephone Encounter (Signed)
Already addressed. Refer to outgoing call.

## 2011-02-04 ENCOUNTER — Other Ambulatory Visit: Payer: Self-pay | Admitting: *Deleted

## 2011-02-04 MED ORDER — SIMVASTATIN 40 MG PO TABS: 40.0000 mg | ORAL_TABLET | Freq: Every day | ORAL | Status: DC

## 2011-02-12 ENCOUNTER — Other Ambulatory Visit: Payer: Self-pay | Admitting: *Deleted

## 2011-02-12 MED ORDER — CLOPIDOGREL BISULFATE 75 MG PO TABS: 75.0000 mg | ORAL_TABLET | Freq: Every day | ORAL | Status: DC

## 2011-04-10 ENCOUNTER — Ambulatory Visit: Payer: Medicare Other | Admitting: Cardiology

## 2011-04-13 ENCOUNTER — Ambulatory Visit (INDEPENDENT_AMBULATORY_CARE_PROVIDER_SITE_OTHER): Payer: Medicare Other | Admitting: Cardiology

## 2011-04-13 ENCOUNTER — Encounter: Payer: Self-pay | Admitting: Cardiology

## 2011-04-13 VITALS — BP 161/68 | HR 64 | Ht 71.0 in | Wt 160.0 lb

## 2011-04-13 DIAGNOSIS — I2510 Atherosclerotic heart disease of native coronary artery without angina pectoris: Secondary | ICD-10-CM

## 2011-04-13 DIAGNOSIS — I10 Essential (primary) hypertension: Secondary | ICD-10-CM

## 2011-04-13 DIAGNOSIS — E785 Hyperlipidemia, unspecified: Secondary | ICD-10-CM

## 2011-04-13 DIAGNOSIS — I359 Nonrheumatic aortic valve disorder, unspecified: Secondary | ICD-10-CM

## 2011-04-13 NOTE — Assessment & Plan Note (Signed)
Lipids have been well controlled.

## 2011-04-13 NOTE — Assessment & Plan Note (Signed)
Continue followup with Dr. Hall. 

## 2011-04-13 NOTE — Assessment & Plan Note (Signed)
Symptomatically stable on medical therapy  Will continue same regimen except stop Plavix after completes current bottle since he has had problems with anemia - GIB and reported hematuria as well. He is a year and a half out from DES placement.

## 2011-04-13 NOTE — Patient Instructions (Signed)
Your physician has recommended you make the following change in your medication: stop taking Plavix after you finish current bottle  Your physician recommends that you schedule a follow-up appointment in: 6 months

## 2011-04-13 NOTE — Assessment & Plan Note (Signed)
Bicuspid aortic valve with mild stenosis.

## 2011-04-13 NOTE — Progress Notes (Signed)
Clinical Summary Brian [name] is a 77 y.o.male presenting for followup. He was seen in June.  He reports intermittent hematuria - resolved when he held Plavix and aspirin over a few days each time. No recurrence. No history of kidney stones by report.  He has had no angina or progressive dyspnea on exertion.  He is due to see Dr. Margo Aye tomorrow.   Allergies  Allergen Reactions  . Codeine     REACTION: nausea    Medication list reviewed.  Past Medical History  Diagnosis Date  . Aortic stenosis 3/11    Very mild, possibly bicuspid valve  . Coronary atherosclerosis of native coronary artery 5/11    Multivessel, DES circumflex 5/11, 50% LAD, occluded RCA, LVEF 60%  . Essential hypertension, benign     Social History Mr. Mcmurtrey reports that he quit smoking about 22 years ago. His smoking use included Cigarettes. He has a 50 pack-year smoking history. He has never used smokeless tobacco. Mr. Santaana reports that he does not drink alcohol.  Review of Systems Negative except as outlined above.  Physical Examination Filed Vitals:   04/13/11 1515  BP: 161/68  Pulse: 64    Thin elderly male in no acute distress.  HEENT: Conjunctiva and lids normal, oropharynx with moist mucosa.  Neck: Supple, no elevated JVP or loud bruits, no thyromegaly.  Lungs: Clear to auscultation, nonlabored.  Cardiac: Regular rate and rhythm, 2/6 systolic murmur heard at the base, second heart sound preserved, no S3 or pericardial  rub.  Abdomen: Soft, nontender, bowel sounds present.  Skin: Warm and dry.  Extremities: No pitting edema, distal pulses are 2+.  Musculoskeletal: No gross deformities.  Neuropsychiatric: Alert and oriented x3, affect appropriate.    Problem List and Plan

## 2011-05-06 ENCOUNTER — Ambulatory Visit (HOSPITAL_COMMUNITY): Payer: Medicare Other | Admitting: Oncology

## 2011-07-30 ENCOUNTER — Other Ambulatory Visit: Payer: Self-pay | Admitting: *Deleted

## 2011-07-30 ENCOUNTER — Telehealth: Payer: Self-pay | Admitting: *Deleted

## 2011-07-30 DIAGNOSIS — I2510 Atherosclerotic heart disease of native coronary artery without angina pectoris: Secondary | ICD-10-CM

## 2011-07-30 MED ORDER — ISOSORBIDE MONONITRATE ER 30 MG PO TB24: 30.0000 mg | ORAL_TABLET | Freq: Every day | ORAL | Status: DC

## 2011-07-30 NOTE — Telephone Encounter (Signed)
Called to verify if pt is still taking isosorbide. Pt is still taking med will send in new RX

## 2011-08-14 DIAGNOSIS — I10 Essential (primary) hypertension: Secondary | ICD-10-CM | POA: Diagnosis not present

## 2011-08-14 DIAGNOSIS — E785 Hyperlipidemia, unspecified: Secondary | ICD-10-CM | POA: Diagnosis not present

## 2011-08-14 DIAGNOSIS — D518 Other vitamin B12 deficiency anemias: Secondary | ICD-10-CM | POA: Diagnosis not present

## 2011-09-11 DIAGNOSIS — E538 Deficiency of other specified B group vitamins: Secondary | ICD-10-CM | POA: Diagnosis not present

## 2011-09-16 DIAGNOSIS — N289 Disorder of kidney and ureter, unspecified: Secondary | ICD-10-CM | POA: Diagnosis not present

## 2011-09-16 DIAGNOSIS — I10 Essential (primary) hypertension: Secondary | ICD-10-CM | POA: Diagnosis not present

## 2011-09-17 DIAGNOSIS — I10 Essential (primary) hypertension: Secondary | ICD-10-CM | POA: Diagnosis not present

## 2011-10-14 ENCOUNTER — Ambulatory Visit: Payer: Medicare Other | Admitting: Cardiology

## 2011-11-03 ENCOUNTER — Ambulatory Visit (INDEPENDENT_AMBULATORY_CARE_PROVIDER_SITE_OTHER): Payer: Medicare Other | Admitting: Cardiology

## 2011-11-03 ENCOUNTER — Encounter: Payer: Self-pay | Admitting: Cardiology

## 2011-11-03 VITALS — BP 139/55 | HR 65 | Ht 70.5 in | Wt 164.0 lb

## 2011-11-03 DIAGNOSIS — I2510 Atherosclerotic heart disease of native coronary artery without angina pectoris: Secondary | ICD-10-CM

## 2011-11-03 DIAGNOSIS — E785 Hyperlipidemia, unspecified: Secondary | ICD-10-CM | POA: Diagnosis not present

## 2011-11-03 DIAGNOSIS — I359 Nonrheumatic aortic valve disorder, unspecified: Secondary | ICD-10-CM | POA: Diagnosis not present

## 2011-11-03 DIAGNOSIS — I10 Essential (primary) hypertension: Secondary | ICD-10-CM

## 2011-11-03 NOTE — Assessment & Plan Note (Signed)
Continue current regimen

## 2011-11-03 NOTE — Assessment & Plan Note (Signed)
Probable bicuspid aortic valve with mild stenosis by echocardiogram 2011. Followup study will be obtained for his next visit.

## 2011-11-03 NOTE — Patient Instructions (Addendum)
**Note De-identified Brian [name] Obfuscation** Your physician recommends that you continue on your current medications as directed. Please refer to the Current Medication list given to you today.  Your physician has requested that you have an echocardiogram. Echocardiography is a painless test that uses sound waves to create images of your heart. It provides your doctor with information about the size and shape of your heart and how well your heart's chambers and valves are working. This procedure takes approximately one hour. There are no restrictions for this procedure.  Your physician recommends that you schedule a follow-up appointment in: 6 months  

## 2011-11-03 NOTE — Assessment & Plan Note (Signed)
Symptomatically stable on medical therapy. Continue observation, 6 month followup.

## 2011-11-03 NOTE — Assessment & Plan Note (Signed)
Followed by Dr. Margo Aye, on statin therapy.

## 2011-11-03 NOTE — Progress Notes (Signed)
   Clinical Summary [name] is a 78 y.o.male presenting for followup. He was seen in December 2012. He is doing relatively well. He does experience angina if he "overdoes it" but states that this resolves very quickly with rest. He has not required any nitroglycerin.  We reviewed his medications. He reports compliance. He has been off Plavix as we outlined last visit. Reports lipid followup with Dr. Margo [name].  Echocardiogram from March 2011 revealed LVEF 60-65% with mild to moderate LVH, probable bicuspid aortic valve with very mild stenosis.   Allergies  Allergen Reactions  . Codeine     REACTION: nausea    Current Outpatient Prescriptions  Medication Sig Dispense Refill  . amLODipine (NORVASC) 5 MG tablet Take 5 mg by mouth daily.        Marland Kitchen aspirin 81 MG tablet Take 81 mg by mouth daily.        . famotidine (PEPCID) 20 MG tablet Take 20 mg by mouth 2 (two) times daily.        . irbesartan (AVAPRO) 300 MG tablet Take 300 mg by mouth at bedtime.        . isosorbide mononitrate (IMDUR) 30 MG 24 hr tablet Take 1 tablet (30 mg total) by mouth daily.  30 tablet  6  . nitroGLYCERIN (NITROSTAT) 0.4 MG SL tablet Place 0.4 mg under the tongue every 5 (five) minutes as needed. TAKE AS DIRECTED FOR CHEST PAIN, NOT TO EXCEED 3 DOSES.       Marland Kitchen simvastatin (ZOCOR) 40 MG tablet Take 1 tablet (40 mg total) by mouth at bedtime.  30 tablet  6    Past Medical History  Diagnosis Date  . Aortic stenosis 3/11    Very mild, possibly bicuspid valve  . Coronary atherosclerosis of native coronary artery 5/11    Multivessel, DES circumflex 5/11, 50% LAD, occluded RCA, LVEF 60%  . Essential hypertension, benign     Social History [name] reports that he quit smoking about 23 years ago. His smoking use included Cigarettes. He has a 50 pack-year smoking history. He has never used smokeless tobacco. [name] reports that he does not drink alcohol.  Review of Systems No bleeding problems, no  further hematuria. No palpitations or syncope. Otherwise negative.  Physical Examination Filed Vitals:   11/03/11 0903  BP: 139/55  Pulse: 65    Thin elderly male in no acute distress.  HEENT: Conjunctiva and lids normal, oropharynx with moist mucosa.  Neck: Supple, no elevated JVP or loud bruits, no thyromegaly.  Lungs: Clear to auscultation, nonlabored.  Cardiac: Regular rate and rhythm, 2/6 systolic murmur heard at the base, second heart sound preserved, no S3 or pericardial  rub.  Abdomen: Soft, nontender, bowel sounds present.  Skin: Warm and dry.  Extremities: No pitting edema, distal pulses are 2+.  Musculoskeletal: No gross deformities.    Problem List and Plan   CORONARY ATHEROSCLEROSIS NATIVE CORONARY ARTERY Symptomatically stable on medical therapy. Continue observation, 6 month followup.  AORTIC VALVE DISORDERS Probable bicuspid aortic valve with mild stenosis by echocardiogram 2011. Followup study will be obtained for his next visit.  ESSENTIAL HYPERTENSION, BENIGN Continue current regimen.  HYPERLIPIDEMIA Followed by Dr. Margo [name], on statin therapy.    Jonelle Sidle, M.D., F.A.C.C.

## 2011-11-09 ENCOUNTER — Other Ambulatory Visit: Payer: Self-pay

## 2011-11-09 MED ORDER — SIMVASTATIN 40 MG PO TABS: 40.0000 mg | ORAL_TABLET | Freq: Every day | ORAL | Status: DC

## 2011-11-10 ENCOUNTER — Ambulatory Visit (HOSPITAL_COMMUNITY)
Admission: RE | Admit: 2011-11-10 | Discharge: 2011-11-10 | Disposition: A | Discharge status: Home / Self Care | Payer: Medicare Other | Source: Ambulatory Visit | Attending: Cardiology | Admitting: Cardiology

## 2011-11-10 DIAGNOSIS — I359 Nonrheumatic aortic valve disorder, unspecified: Secondary | ICD-10-CM

## 2011-11-10 DIAGNOSIS — E785 Hyperlipidemia, unspecified: Secondary | ICD-10-CM | POA: Insufficient documentation

## 2011-11-10 DIAGNOSIS — I10 Essential (primary) hypertension: Secondary | ICD-10-CM | POA: Diagnosis not present

## 2011-11-10 NOTE — Progress Notes (Signed)
*  PRELIMINARY RESULTS* Echocardiogram 2D Echocardiogram has been performed.  Caswell Corwin 11/10/2011, 9:19 AM

## 2011-11-16 DIAGNOSIS — C4441 Basal cell carcinoma of skin of scalp and neck: Secondary | ICD-10-CM | POA: Diagnosis not present

## 2011-11-16 DIAGNOSIS — L570 Actinic keratosis: Secondary | ICD-10-CM | POA: Diagnosis not present

## 2011-11-16 DIAGNOSIS — D235 Other benign neoplasm of skin of trunk: Secondary | ICD-10-CM | POA: Diagnosis not present

## 2011-11-16 DIAGNOSIS — C4449 Other specified malignant neoplasm of skin of scalp and neck: Secondary | ICD-10-CM | POA: Diagnosis not present

## 2011-12-31 DIAGNOSIS — Z85828 Personal history of other malignant neoplasm of skin: Secondary | ICD-10-CM | POA: Diagnosis not present

## 2011-12-31 DIAGNOSIS — B079 Viral wart, unspecified: Secondary | ICD-10-CM | POA: Diagnosis not present

## 2011-12-31 DIAGNOSIS — L570 Actinic keratosis: Secondary | ICD-10-CM | POA: Diagnosis not present

## 2012-02-24 DIAGNOSIS — Z23 Encounter for immunization: Secondary | ICD-10-CM | POA: Diagnosis not present

## 2012-02-24 DIAGNOSIS — E538 Deficiency of other specified B group vitamins: Secondary | ICD-10-CM | POA: Diagnosis not present

## 2012-03-29 DIAGNOSIS — D649 Anemia, unspecified: Secondary | ICD-10-CM | POA: Diagnosis not present

## 2012-03-29 DIAGNOSIS — K219 Gastro-esophageal reflux disease without esophagitis: Secondary | ICD-10-CM | POA: Diagnosis not present

## 2012-03-29 DIAGNOSIS — E538 Deficiency of other specified B group vitamins: Secondary | ICD-10-CM | POA: Diagnosis not present

## 2012-03-29 DIAGNOSIS — I259 Chronic ischemic heart disease, unspecified: Secondary | ICD-10-CM | POA: Diagnosis not present

## 2012-03-29 DIAGNOSIS — E785 Hyperlipidemia, unspecified: Secondary | ICD-10-CM | POA: Diagnosis not present

## 2012-03-29 DIAGNOSIS — I10 Essential (primary) hypertension: Secondary | ICD-10-CM | POA: Diagnosis not present

## 2012-04-21 DIAGNOSIS — H31019 Macula scars of posterior pole (postinflammatory) (post-traumatic), unspecified eye: Secondary | ICD-10-CM | POA: Diagnosis not present

## 2012-04-21 DIAGNOSIS — H5231 Anisometropia: Secondary | ICD-10-CM | POA: Diagnosis not present

## 2012-04-29 ENCOUNTER — Other Ambulatory Visit: Payer: Self-pay | Admitting: Cardiology

## 2012-05-10 ENCOUNTER — Encounter: Payer: Self-pay | Admitting: Cardiology

## 2012-05-10 ENCOUNTER — Ambulatory Visit (INDEPENDENT_AMBULATORY_CARE_PROVIDER_SITE_OTHER): Payer: Medicare Other | Admitting: Cardiology

## 2012-05-10 VITALS — BP 142/62 | HR 68 | Ht 70.5 in | Wt 171.2 lb

## 2012-05-10 DIAGNOSIS — I2510 Atherosclerotic heart disease of native coronary artery without angina pectoris: Secondary | ICD-10-CM

## 2012-05-10 DIAGNOSIS — I10 Essential (primary) hypertension: Secondary | ICD-10-CM

## 2012-05-10 DIAGNOSIS — I359 Nonrheumatic aortic valve disorder, unspecified: Secondary | ICD-10-CM | POA: Diagnosis not present

## 2012-05-10 DIAGNOSIS — E785 Hyperlipidemia, unspecified: Secondary | ICD-10-CM

## 2012-05-10 MED ORDER — ATORVASTATIN CALCIUM 40 MG PO TABS: 40.0000 mg | ORAL_TABLET | Freq: Every day | ORAL | Status: DC

## 2012-05-10 NOTE — Assessment & Plan Note (Signed)
Stable, mildly stenotic aortic valve by echocardiogram in July 2013.

## 2012-05-10 NOTE — Assessment & Plan Note (Signed)
Symptomatically stable on medical therapy. Continue observation. 

## 2012-05-10 NOTE — Assessment & Plan Note (Signed)
No change to current regimen. 

## 2012-05-10 NOTE — Progress Notes (Signed)
Clinical Summary [name] is a 79 y.o.male presenting for followup. He was seen in July. Reports no angina symptoms or nitroglycerin use. 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  Followup echocardiogram was done in July 2013 demonstrating LVEF 55-60% with calcified mildly stenotic aortic valve, mild mitral regurgitation, moderate left atrial enlargement.   ECG today shows sinus rhythm with prolonged PR interval, high lateral Q waves.  We discussed his medications, now on Norvasc 10 mg daily. Elected to switch Zocor to Lipitor to decrease chance of interaction. Lipids have been followed by Dr. Margo [name].   Allergies  Allergen Reactions  . Codeine     REACTION: nausea    Current Outpatient Prescriptions  Medication Sig Dispense Refill  . amLODipine (NORVASC) 10 MG tablet Take 10 mg by mouth daily.      Marland Kitchen aspirin 81 MG tablet Take 81 mg by mouth daily.        . famotidine (PEPCID) 20 MG tablet Take 20 mg by mouth 2 (two) times daily.        . irbesartan (AVAPRO) 300 MG tablet Take 300 mg by mouth at bedtime.        . isosorbide mononitrate (IMDUR) 30 MG 24 hr tablet take 1 tablet once daily  90 tablet  3  . nitroGLYCERIN (NITROSTAT) 0.4 MG SL tablet Place 0.4 mg under the tongue every 5 (five) minutes as needed. TAKE AS DIRECTED FOR CHEST PAIN, NOT TO EXCEED 3 DOSES.       Marland Kitchen atorvastatin (LIPITOR) 40 MG tablet Take 1 tablet (40 mg total) by mouth daily.  30 tablet  6    Past Medical History  Diagnosis Date  . Aortic stenosis 3/11    Very mild, possibly bicuspid valve  . Coronary atherosclerosis of native coronary artery 5/11    Multivessel, DES circumflex 5/11, 50% LAD, occluded RCA, LVEF 60%  . Essential hypertension, benign     Social History [name] reports that he quit smoking about 23 years ago. His smoking use  included Cigarettes. He has a 50 pack-year smoking history. He has never used smokeless tobacco. [name] reports that he does not drink alcohol.  Review of Systems No palpitations or Brian [name] syncope. Stable appetite. No bleeding episodes. No cardiac hospitalizations. Otherwise negative.  Physical Examination Filed Vitals:   05/10/12 0927  BP: 142/62  Pulse: 68   Filed Weights   05/10/12 0927  Weight: 171 lb 4 oz (77.678 kg)    Thin elderly male in no acute distress.  HEENT: Conjunctiva and lids normal, oropharynx with moist mucosa.  Neck: Supple, no elevated JVP or loud bruits, no thyromegaly.  Lungs: Clear to auscultation, nonlabored.  Cardiac: Regular rate and rhythm, 2/6 systolic murmur heard at the base, second heart sound preserved, no S3 or pericardial  rub.  Abdomen: Soft, nontender, bowel sounds present.  Skin: Warm and dry.  Extremities: No pitting edema, distal pulses are 2+.  Musculoskeletal: No gross deformities.    Problem List and Plan   CORONARY ATHEROSCLEROSIS NATIVE CORONARY ARTERY Symptomatically stable on medical therapy. Continue observation.  HYPERLIPIDEMIA Will switch from Zocor to Lipitor 40 mg daily given concurrent use of Norvasc 10 mg daily.  ESSENTIAL HYPERTENSION, BENIGN No change to current regimen.  AORTIC VALVE DISORDERS Stable, mildly stenotic aortic valve by echocardiogram in July 2013.    Brian Deter  Delman [name], M.D., F.A.C.C.

## 2012-05-10 NOTE — Patient Instructions (Addendum)
Your physician recommends that you schedule a follow-up appointment in: 6 months   Your physician has recommended you make the following change in your medication:  1 - START Atorvastatin (Lipitor) 40 mg daily after finishing current bottle of Zocor

## 2012-05-10 NOTE — Assessment & Plan Note (Signed)
Will switch from Zocor to Lipitor 40 mg daily given concurrent use of Norvasc 10 mg daily.

## 2012-07-14 DIAGNOSIS — D040 Carcinoma in situ of skin of lip: Secondary | ICD-10-CM | POA: Diagnosis not present

## 2012-07-14 DIAGNOSIS — L570 Actinic keratosis: Secondary | ICD-10-CM | POA: Diagnosis not present

## 2012-07-14 DIAGNOSIS — Z85828 Personal history of other malignant neoplasm of skin: Secondary | ICD-10-CM | POA: Diagnosis not present

## 2012-07-14 DIAGNOSIS — D235 Other benign neoplasm of skin of trunk: Secondary | ICD-10-CM | POA: Diagnosis not present

## 2012-08-15 DIAGNOSIS — L570 Actinic keratosis: Secondary | ICD-10-CM | POA: Diagnosis not present

## 2012-11-23 DIAGNOSIS — E785 Hyperlipidemia, unspecified: Secondary | ICD-10-CM | POA: Diagnosis not present

## 2012-11-23 DIAGNOSIS — I10 Essential (primary) hypertension: Secondary | ICD-10-CM | POA: Diagnosis not present

## 2012-11-23 DIAGNOSIS — R5381 Other malaise: Secondary | ICD-10-CM | POA: Diagnosis not present

## 2012-11-23 DIAGNOSIS — E119 Type 2 diabetes mellitus without complications: Secondary | ICD-10-CM | POA: Diagnosis not present

## 2012-11-30 DIAGNOSIS — E538 Deficiency of other specified B group vitamins: Secondary | ICD-10-CM | POA: Diagnosis not present

## 2012-11-30 DIAGNOSIS — I10 Essential (primary) hypertension: Secondary | ICD-10-CM | POA: Diagnosis not present

## 2012-11-30 DIAGNOSIS — M7989 Other specified soft tissue disorders: Secondary | ICD-10-CM | POA: Diagnosis not present

## 2012-11-30 DIAGNOSIS — D649 Anemia, unspecified: Secondary | ICD-10-CM | POA: Diagnosis not present

## 2012-11-30 DIAGNOSIS — E785 Hyperlipidemia, unspecified: Secondary | ICD-10-CM | POA: Diagnosis not present

## 2012-12-15 ENCOUNTER — Encounter: Payer: Self-pay | Admitting: Cardiology

## 2012-12-15 ENCOUNTER — Ambulatory Visit (INDEPENDENT_AMBULATORY_CARE_PROVIDER_SITE_OTHER): Payer: Medicare Other | Admitting: Cardiology

## 2012-12-15 VITALS — BP 116/61 | HR 62 | Ht 71.0 in | Wt 166.2 lb

## 2012-12-15 DIAGNOSIS — I2510 Atherosclerotic heart disease of native coronary artery without angina pectoris: Secondary | ICD-10-CM | POA: Diagnosis not present

## 2012-12-15 DIAGNOSIS — I359 Nonrheumatic aortic valve disorder, unspecified: Secondary | ICD-10-CM

## 2012-12-15 DIAGNOSIS — E785 Hyperlipidemia, unspecified: Secondary | ICD-10-CM | POA: Diagnosis not present

## 2012-12-15 NOTE — Assessment & Plan Note (Signed)
Stable examination. No indication for followup echocardiogram at this time.

## 2012-12-15 NOTE — Progress Notes (Signed)
   Clinical Summary Brian [name] is an 79 y.o.male last seen in January. Has continued to do well, occasional angina with higher than normal activities, no progression however. No cardiac hospitalizations. States he has tolerated the change to Lipitor we made at the last visit, had recent lipids per Brian [name] which will be requested.  ECG today shows sinus bradycardia with prolonged PR, old lateral infarct pattern.  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   Allergies  Allergen Reactions  . Codeine     REACTION: nausea    Current Outpatient Prescriptions  Medication Sig Dispense Refill  . amLODipine (NORVASC) 10 MG tablet Take 10 mg by mouth daily.      Marland Kitchen aspirin 81 MG tablet Take 81 mg by mouth daily.        Marland Kitchen atorvastatin (LIPITOR) 40 MG tablet Take 1 tablet (40 mg total) by mouth daily.  30 tablet  6  . famotidine (PEPCID) 20 MG tablet Take 20 mg by mouth 2 (two) times daily.        . irbesartan (AVAPRO) 300 MG tablet Take 300 mg by mouth at bedtime.        . isosorbide mononitrate (IMDUR) 30 MG 24 hr tablet take 1 tablet once daily  90 tablet  3  . nitroGLYCERIN (NITROSTAT) 0.4 MG SL tablet Place 0.4 mg under the tongue every 5 (five) minutes as needed. TAKE AS DIRECTED FOR CHEST PAIN, NOT TO EXCEED 3 DOSES.        No current facility-administered medications for this visit.    Past Medical History  Diagnosis Date  . Aortic stenosis 3/11    Very mild, possibly bicuspid valve  . Coronary atherosclerosis of native coronary artery 5/11    Multivessel, DES circumflex 5/11, 50% LAD, occluded RCA, LVEF 60%  . Essential hypertension, benign     Social History Brian [name] reports that he quit smoking about 24 years ago. His smoking use included Cigarettes. He has a 50 pack-year smoking history. He has never used smokeless tobacco. Brian [name] reports that he does not drink alcohol.  Review of Systems No bleeding problems.  Mild ankle edema when he is up on his feet most of the day. Otherwise negative.  Physical Examination Filed Vitals:   12/15/12 1337  BP: 116/61  Pulse: 62   Filed Weights   12/15/12 1337  Weight: 166 lb 4 oz (75.411 kg)    Thin elderly male in no acute distress.  HEENT: Conjunctiva and lids normal, oropharynx with moist mucosa.  Neck: Supple, no elevated JVP or loud bruits, no thyromegaly.  Lungs: Clear to auscultation, nonlabored.  Cardiac: Regular rate and rhythm, 2/6 systolic murmur heard at the base, second heart sound preserved, no S3 or pericardial  rub.  Skin: Warm and dry.  Extremities: No pitting edema, distal pulses are 2+.     Problem List and Plan   CORONARY ATHEROSCLEROSIS NATIVE CORONARY ARTERY Symptomatically stable on medical therapy. ECG reviewed. No changes were made today.  Aortic valve disorders Stable examination. No indication for followup echocardiogram at this time.  HYPERLIPIDEMIA Will request most recent lipid panel from Brian [name].    Jonelle Sidle, M.D., F.A.C.C.

## 2012-12-15 NOTE — Assessment & Plan Note (Signed)
Will request most recent lipid panel from Dr. Margo Aye.

## 2012-12-15 NOTE — Patient Instructions (Addendum)
Your physician recommends that you schedule a follow-up appointment in: 6 MONTHS 

## 2012-12-15 NOTE — Assessment & Plan Note (Signed)
Symptomatically stable on medical therapy. ECG reviewed. No changes were made today.

## 2012-12-21 DIAGNOSIS — I10 Essential (primary) hypertension: Secondary | ICD-10-CM | POA: Diagnosis not present

## 2012-12-21 DIAGNOSIS — D638 Anemia in other chronic diseases classified elsewhere: Secondary | ICD-10-CM | POA: Diagnosis not present

## 2013-01-09 ENCOUNTER — Telehealth: Payer: Self-pay

## 2013-01-09 MED ORDER — ATORVASTATIN CALCIUM 40 MG PO TABS: 40.0000 mg | ORAL_TABLET | Freq: Every day | ORAL | Status: DC

## 2013-01-09 NOTE — Telephone Encounter (Signed)
rx sent to pharmacy by e-script  

## 2013-01-09 NOTE — Telephone Encounter (Signed)
Patient out of atorvastatin (LIPITOR) 40 MG tablet daily  Please call in, per patient [facility] faxed request to [place] last week.

## 2013-06-21 ENCOUNTER — Telehealth: Payer: Self-pay | Admitting: Cardiology

## 2013-06-21 MED ORDER — ISOSORBIDE MONONITRATE ER 30 MG PO TB24: 30.0000 mg | ORAL_TABLET | Freq: Every day | ORAL | Status: DC

## 2013-06-21 NOTE — Telephone Encounter (Signed)
rx refilled per request

## 2013-06-21 NOTE — Telephone Encounter (Signed)
Received fax refill request  Rx # [id number] Medication:  Isosorbide MN ER 30 mg tablet Qty 90 Sig:  Take one tablet once daily Physician:  Domenic Polite

## 2013-08-25 ENCOUNTER — Telehealth: Payer: Self-pay | Admitting: Cardiology

## 2013-08-25 MED ORDER — ATORVASTATIN CALCIUM 40 MG PO TABS: 40.0000 mg | ORAL_TABLET | Freq: Every day | ORAL | Status: DC

## 2013-08-25 NOTE — Telephone Encounter (Signed)
Received fax refill request  Rx # [id number] Medication:  Atorvastatin 40 mg tablet Qty 30 Sig:  Take one tablet by mouth once daily Physician:  Domenic Polite

## 2013-08-25 NOTE — Telephone Encounter (Signed)
Refill given for 90 day supply atorvastatin

## 2014-01-26 DIAGNOSIS — I10 Essential (primary) hypertension: Secondary | ICD-10-CM | POA: Diagnosis not present

## 2014-01-26 DIAGNOSIS — Z23 Encounter for immunization: Secondary | ICD-10-CM | POA: Diagnosis not present

## 2014-07-06 ENCOUNTER — Other Ambulatory Visit: Payer: Self-pay | Admitting: Cardiology

## 2014-07-06 MED ORDER — ATORVASTATIN CALCIUM 40 MG PO TABS: 40.0000 mg | ORAL_TABLET | Freq: Every day | ORAL | Status: DC

## 2014-07-06 MED ORDER — ISOSORBIDE MONONITRATE ER 30 MG PO TB24: 30.0000 mg | ORAL_TABLET | Freq: Every day | ORAL | Status: DC

## 2014-07-06 NOTE — Telephone Encounter (Signed)
Received fax refill request  Rx # [id number] Medication:  Isosorbide MN ER 30 mg tablet Qty 90 Sig:  Take one tablet by mouth once daily Physician:  Domenic Polite Received fax refill request  Rx # [id number] Medication:  Atorvastatin 40 mg tablet Qty 90 Sig:  Take one tablet by mouth once daily Physician:  Domenic Polite

## 2014-08-29 ENCOUNTER — Other Ambulatory Visit: Payer: Self-pay | Admitting: Cardiology

## 2014-08-29 MED ORDER — ISOSORBIDE MONONITRATE ER 30 MG PO TB24: 30.0000 mg | ORAL_TABLET | Freq: Every day | ORAL | Status: DC

## 2014-08-29 NOTE — Telephone Encounter (Signed)
Received fax refill request  Rx # [id number] Medication:  Isosorbide MN ER 30 MG Tablet  Qty 30 Sig:  Take 1 tablet by mouth once daily Physician:  Rozann Lesches

## 2014-08-29 NOTE — Telephone Encounter (Signed)
Not seen in 2 yrs needs apt

## 2014-10-03 ENCOUNTER — Telehealth: Payer: Self-pay | Admitting: Cardiology

## 2014-10-03 DIAGNOSIS — Z23 Encounter for immunization: Secondary | ICD-10-CM | POA: Diagnosis not present

## 2014-10-03 MED ORDER — ISOSORBIDE MONONITRATE ER 30 MG PO TB24: 30.0000 mg | ORAL_TABLET | Freq: Every day | ORAL | Status: DC

## 2014-10-03 NOTE — Telephone Encounter (Signed)
Pt is out of medication

## 2014-10-03 NOTE — Telephone Encounter (Signed)
Refill complete 

## 2014-10-29 ENCOUNTER — Other Ambulatory Visit: Payer: Self-pay

## 2014-10-29 MED ORDER — ATORVASTATIN CALCIUM 40 MG PO TABS: 40.0000 mg | ORAL_TABLET | Freq: Every day | ORAL | Status: DC

## 2014-10-29 NOTE — Telephone Encounter (Signed)
Refill complete 

## 2014-11-09 ENCOUNTER — Ambulatory Visit (INDEPENDENT_AMBULATORY_CARE_PROVIDER_SITE_OTHER): Payer: Medicare Other | Admitting: Cardiology

## 2014-11-09 ENCOUNTER — Encounter: Payer: Self-pay | Admitting: Cardiology

## 2014-11-09 VITALS — BP 130/60 | HR 75 | Ht 70.5 in | Wt 162.8 lb

## 2014-11-09 DIAGNOSIS — E782 Mixed hyperlipidemia: Secondary | ICD-10-CM | POA: Diagnosis not present

## 2014-11-09 DIAGNOSIS — I10 Essential (primary) hypertension: Secondary | ICD-10-CM | POA: Diagnosis not present

## 2014-11-09 DIAGNOSIS — I25119 Atherosclerotic heart disease of native coronary artery with unspecified angina pectoris: Secondary | ICD-10-CM | POA: Diagnosis not present

## 2014-11-09 DIAGNOSIS — R0600 Dyspnea, unspecified: Secondary | ICD-10-CM

## 2014-11-09 NOTE — Patient Instructions (Signed)
Your physician wants you to follow-up in: 1 year with Dr.McDowell You will receive a reminder letter in the mail two months in advance. If you don't receive a letter, please call our office to schedule the follow-up appointment.  Your physician recommends that you continue on your current medications as directed. Please refer to the Current Medication list given to you today.  Your physician has requested that you have an echocardiogram. Echocardiography is a painless test that uses sound waves to create images of your heart. It provides your doctor with information about the size and shape of your heart and how well your heart's chambers and valves are working. This procedure takes approximately one hour. There are no restrictions for this procedure.   Your physician has requested that you have a lexiscan myoview. For further information please visit [email or web address]. Please follow instruction sheet, as given.    Thank you for choosing [facility] !

## 2014-11-09 NOTE — Progress Notes (Signed)
Cardiology Office Note  Date: 11/09/2014   ID: AYSON [name], DOB Sep 25, 1927, MRN [id number]  PCP: Delphina Cahill, MD  Primary Cardiologist: Rozann Lesches, MD   Chief Complaint  Patient presents with  . Coronary Artery Disease  . Aortic Stenosis    History of Present Illness: Brian [name] is an 81 y.o. male not seen since August 2014. He presents for a follow-up visit. Reports dyspnea on exertion, usually NYHA class II, worse if he "overdoes it." Only occasional angina symptoms. He reports compliance with his medications which are outlined below.  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  Follow-up ECG today shows sinus bradycardia with old inferolateral infarct pattern. He has not undergone follow-up ischemic testing since his last intervention.  He continues to follow with Dr. Nevada Crane for lipid management, tolerating Lipitor.    Past Medical History  Diagnosis Date  . Aortic stenosis 3/11    Very mild, possibly bicuspid valve  . Coronary atherosclerosis of native coronary artery 5/11    Multivessel, DES circumflex 5/11, 50% LAD, occluded RCA, LVEF 60%  . Essential hypertension, benign     Past Surgical History  Procedure Laterality Date  . Cataract extraction      BILATERAL    Current Outpatient Prescriptions  Medication Sig Dispense Refill  . amLODipine (NORVASC) 10 MG tablet Take 10 mg by mouth daily.    Marland Kitchen aspirin 81 MG tablet Take 81 mg by mouth daily.      Marland Kitchen atorvastatin (LIPITOR) 40 MG tablet Take 1 tablet (40 mg total) by mouth daily. 30 tablet 3  . famotidine (PEPCID) 20 MG tablet Take 20 mg by mouth 2 (two) times daily.      . irbesartan (AVAPRO) 300 MG tablet Take 300 mg by mouth at bedtime.      . isosorbide mononitrate (IMDUR) 30 MG 24 hr tablet Take 1 tablet (30 mg total) by mouth daily. 90 tablet 3  . nitroGLYCERIN (NITROSTAT) 0.4 MG SL tablet Place 0.4 mg under the tongue  every 5 (five) minutes as needed. TAKE AS DIRECTED FOR CHEST PAIN, NOT TO EXCEED 3 DOSES.      No current facility-administered medications for this visit.    Allergies:  Codeine   Social History: The patient  reports that he quit smoking about 26 years ago. His smoking use included Cigarettes. He has a 50 pack-year smoking history. He has never used smokeless tobacco. He reports that he does not drink alcohol or use illicit drugs.   ROS:  Please see the history of present illness. Otherwise, complete review of systems is positive for arthritic pains, chronic neck pain.  All other systems are reviewed and negative.   Physical Exam: VS:  BP 130/60 mmHg  Pulse 75  Ht 5' 10.5" (1.791 m)  Wt 162 lb 12.8 oz (73.846 kg)  BMI 23.02 kg/m2  SpO2 97%, BMI Body mass index is 23.02 kg/(m^2).  Wt Readings from Last 3 Encounters:  11/09/14 162 lb 12.8 oz (73.846 kg)  12/15/12 166 lb 4 oz (75.411 kg)  05/10/12 171 lb 4 oz (77.678 kg)     Thin elderly male in no acute distress.  HEENT: Conjunctiva and lids normal, oropharynx with moist mucosa.  Neck: Supple, no elevated JVP, no bruits, no thyromegaly.  Lungs: Clear to auscultation, nonlabored.  Cardiac: Regular rate and rhythm, 4-6/5 systolic murmur, second heart sound preserved, no S3 or pericardial  rub.  Abdomen: Soft, nontender, bowel sounds present. Skin: Warm and dry.  Extremities: No pitting edema, distal pulses are 2+.  Neuropsychiatric: Alert and oriented 3, affect appropriate.   ECG: ECG is ordered today and reviewed showing sinus bradycardia with old inferolateral infarct pattern.  Recent Labwork:  11/23/2012: Hemoglobin 10.2, platelets 105, potassium 4.5, BUN 30, creatinine 1.6, AST 15, ALT 9, cholesterol 95, triglycerides 99, HDL 26, LDL 49  Other Studies Reviewed Today:  Echocardiogram 11/10/2011: Study Conclusions  - Left ventricle: The cavity size was normal. There was mild concentric hypertrophy. Systolic  function was normal. The estimated ejection fraction was in the range of 55% to 60%. - Aortic valve: Not well seen Cannot tell if it is trileaflet. Heavily calcified There was mild stenosis. Valve area: 1.73cm^2 (Vmax). - Mitral valve: Mild regurgitation. - Left atrium: The atrium was moderately dilated. - Atrial septum: No defect or patent foramen ovale was identified.  ASSESSMENT AND PLAN:  1. Multivessel CAD status post DES to the circumflex in 2011, 50% LAD and occluded RCA which were managed medically. We will plan a follow-up Lexiscan Cardiolite on medical therapy to reassess ischemic burden, it has been 5 years since revascularization.  2. Hyperlipidemia, on Lipitor, followed by Dr. Nevada Crane.  3. Mild aortic stenosis by echocardiogram in 2013. Follow-up echocardiogram will be obtained.  4. Essential hypertension, blood pressure control is reasonable.  Current medicines were reviewed at length with the patient today.   Orders Placed This Encounter  Procedures  . NM Myocar Multi W/Spect W/Wall Motion / EF  . Myocardial Perfusion Imaging  . EKG 12-Lead  . Echocardiogram    Disposition: FU with me in 1 year.   Signed, Satira Sark, MD, [facility] 11/09/2014 10:26 AM    [name] [address] [place], [place] [address] Phone: [phone]; Fax: [phone]

## 2014-11-19 ENCOUNTER — Encounter (HOSPITAL_COMMUNITY)
Admission: RE | Admit: 2014-11-19 | Discharge: 2014-11-19 | Disposition: A | Discharge status: Home / Self Care | Payer: Medicare Other | Source: Ambulatory Visit | Attending: Cardiology | Admitting: Cardiology

## 2014-11-19 ENCOUNTER — Encounter (HOSPITAL_COMMUNITY): Payer: Medicare Other

## 2014-11-19 ENCOUNTER — Encounter (HOSPITAL_COMMUNITY): Payer: Self-pay

## 2014-11-19 ENCOUNTER — Ambulatory Visit (HOSPITAL_COMMUNITY)
Admission: RE | Admit: 2014-11-19 | Discharge: 2014-11-19 | Disposition: A | Discharge status: Home / Self Care | Payer: Medicare Other | Source: Ambulatory Visit | Attending: Cardiology | Admitting: Cardiology

## 2014-11-19 ENCOUNTER — Ambulatory Visit (HOSPITAL_COMMUNITY): Admission: RE | Admit: 2014-11-19 | Payer: Medicare Other | Source: Ambulatory Visit

## 2014-11-19 DIAGNOSIS — R0600 Dyspnea, unspecified: Secondary | ICD-10-CM | POA: Insufficient documentation

## 2014-11-19 DIAGNOSIS — I083 Combined rheumatic disorders of mitral, aortic and tricuspid valves: Secondary | ICD-10-CM | POA: Insufficient documentation

## 2014-11-19 DIAGNOSIS — R0602 Shortness of breath: Secondary | ICD-10-CM | POA: Insufficient documentation

## 2014-11-19 DIAGNOSIS — I25119 Atherosclerotic heart disease of native coronary artery with unspecified angina pectoris: Secondary | ICD-10-CM | POA: Diagnosis not present

## 2014-11-19 LAB — NM MYOCAR MULTI W/SPECT W/WALL MOTION / EF
CHL CUP NUCLEAR SRS: 3
CSEPPHR: 78 {beats}/min
LV dias vol: 90 mL
LV sys vol: 24 mL
NUC STRESS TID: 1.14
Rest HR: 51 {beats}/min
SDS: 1
SSS: 4

## 2014-11-19 MED ORDER — TECHNETIUM TC 99M SESTAMIBI - CARDIOLITE
30.0000 | Freq: Once | INTRAVENOUS | Status: AC | PRN
  Administered 2014-11-19: 30 via INTRAVENOUS

## 2014-11-19 MED ORDER — REGADENOSON 0.4 MG/5ML IV SOLN
INTRAVENOUS | Status: AC
  Administered 2014-11-19: 0.4 mg
  Filled 2014-11-19: qty 5

## 2014-11-19 MED ORDER — TECHNETIUM TC 99M SESTAMIBI GENERIC - CARDIOLITE
10.0000 | Freq: Once | INTRAVENOUS | Status: AC | PRN
  Administered 2014-11-19: 10 via INTRAVENOUS

## 2015-03-18 ENCOUNTER — Other Ambulatory Visit: Payer: Self-pay

## 2015-03-18 MED ORDER — ATORVASTATIN CALCIUM 40 MG PO TABS
40.0000 mg | ORAL_TABLET | Freq: Every day | ORAL | Status: DC
Start: 2015-03-18 — End: 2015-06-28

## 2015-06-28 ENCOUNTER — Other Ambulatory Visit: Payer: Self-pay | Admitting: Cardiology

## 2015-07-12 DIAGNOSIS — H6501 Acute serous otitis media, right ear: Secondary | ICD-10-CM | POA: Diagnosis not present

## 2015-07-18 DIAGNOSIS — S0181XA Laceration without foreign body of other part of head, initial encounter: Secondary | ICD-10-CM | POA: Diagnosis not present

## 2015-07-19 ENCOUNTER — Other Ambulatory Visit: Payer: Self-pay

## 2015-07-19 MED ORDER — ATORVASTATIN CALCIUM 40 MG PO TABS: 40.0000 mg | ORAL_TABLET | Freq: Every day | ORAL | Status: AC

## 2015-07-19 NOTE — Telephone Encounter (Signed)
90day supply sent.

## 2015-07-22 DIAGNOSIS — B078 Other viral warts: Secondary | ICD-10-CM | POA: Diagnosis not present

## 2015-07-22 DIAGNOSIS — L723 Sebaceous cyst: Secondary | ICD-10-CM | POA: Diagnosis not present

## 2015-07-26 DIAGNOSIS — Z125 Encounter for screening for malignant neoplasm of prostate: Secondary | ICD-10-CM | POA: Diagnosis not present

## 2015-07-26 DIAGNOSIS — E782 Mixed hyperlipidemia: Secondary | ICD-10-CM | POA: Diagnosis not present

## 2015-07-30 DIAGNOSIS — R011 Cardiac murmur, unspecified: Secondary | ICD-10-CM | POA: Diagnosis not present

## 2015-07-30 DIAGNOSIS — K219 Gastro-esophageal reflux disease without esophagitis: Secondary | ICD-10-CM | POA: Diagnosis not present

## 2015-07-30 DIAGNOSIS — I259 Chronic ischemic heart disease, unspecified: Secondary | ICD-10-CM | POA: Diagnosis not present

## 2015-07-30 DIAGNOSIS — I10 Essential (primary) hypertension: Secondary | ICD-10-CM | POA: Diagnosis not present

## 2015-08-05 DIAGNOSIS — L723 Sebaceous cyst: Secondary | ICD-10-CM | POA: Diagnosis not present

## 2015-08-13 DIAGNOSIS — R011 Cardiac murmur, unspecified: Secondary | ICD-10-CM | POA: Diagnosis not present

## 2015-08-13 DIAGNOSIS — I10 Essential (primary) hypertension: Secondary | ICD-10-CM | POA: Diagnosis not present

## 2015-08-13 DIAGNOSIS — K219 Gastro-esophageal reflux disease without esophagitis: Secondary | ICD-10-CM | POA: Diagnosis not present

## 2015-08-13 DIAGNOSIS — I259 Chronic ischemic heart disease, unspecified: Secondary | ICD-10-CM | POA: Diagnosis not present

## 2015-09-04 ENCOUNTER — Ambulatory Visit (HOSPITAL_COMMUNITY): Payer: Medicare Other

## 2015-09-04 ENCOUNTER — Other Ambulatory Visit (HOSPITAL_COMMUNITY): Payer: Self-pay | Admitting: Internal Medicine

## 2015-09-04 DIAGNOSIS — R944 Abnormal results of kidney function studies: Secondary | ICD-10-CM

## 2015-09-04 DIAGNOSIS — N289 Disorder of kidney and ureter, unspecified: Secondary | ICD-10-CM

## 2015-09-05 ENCOUNTER — Ambulatory Visit (HOSPITAL_COMMUNITY)
Admission: RE | Admit: 2015-09-05 | Discharge: 2015-09-05 | Disposition: A | Discharge status: Home / Self Care | Payer: Medicare Other | Source: Ambulatory Visit | Attending: Internal Medicine | Admitting: Internal Medicine

## 2015-09-05 DIAGNOSIS — N1330 Unspecified hydronephrosis: Secondary | ICD-10-CM | POA: Diagnosis not present

## 2015-09-05 DIAGNOSIS — N289 Disorder of kidney and ureter, unspecified: Secondary | ICD-10-CM | POA: Insufficient documentation

## 2015-09-05 DIAGNOSIS — R944 Abnormal results of kidney function studies: Secondary | ICD-10-CM

## 2015-09-05 DIAGNOSIS — N270 Small kidney, unilateral: Secondary | ICD-10-CM | POA: Diagnosis not present

## 2015-09-17 ENCOUNTER — Other Ambulatory Visit (HOSPITAL_COMMUNITY): Payer: Self-pay | Admitting: Internal Medicine

## 2015-09-17 DIAGNOSIS — N1339 Other hydronephrosis: Secondary | ICD-10-CM

## 2015-09-18 ENCOUNTER — Ambulatory Visit (HOSPITAL_COMMUNITY)
Admission: RE | Admit: 2015-09-18 | Discharge: 2015-09-18 | Disposition: A | Discharge status: Home / Self Care | Payer: Medicare Other | Source: Ambulatory Visit | Attending: Internal Medicine | Admitting: Internal Medicine

## 2015-09-18 DIAGNOSIS — J90 Pleural effusion, not elsewhere classified: Secondary | ICD-10-CM | POA: Diagnosis not present

## 2015-09-18 DIAGNOSIS — N1330 Unspecified hydronephrosis: Secondary | ICD-10-CM | POA: Insufficient documentation

## 2015-09-18 DIAGNOSIS — J929 Pleural plaque without asbestos: Secondary | ICD-10-CM | POA: Diagnosis not present

## 2015-09-18 DIAGNOSIS — I714 Abdominal aortic aneurysm, without rupture: Secondary | ICD-10-CM | POA: Diagnosis not present

## 2015-09-18 DIAGNOSIS — N1339 Other hydronephrosis: Secondary | ICD-10-CM

## 2015-09-18 DIAGNOSIS — K8020 Calculus of gallbladder without cholecystitis without obstruction: Secondary | ICD-10-CM | POA: Diagnosis not present

## 2015-09-20 ENCOUNTER — Ambulatory Visit: Payer: Medicare Other | Admitting: Urology

## 2015-09-23 ENCOUNTER — Other Ambulatory Visit: Payer: Self-pay | Admitting: Cardiology

## 2015-09-26 DIAGNOSIS — R809 Proteinuria, unspecified: Secondary | ICD-10-CM | POA: Diagnosis not present

## 2015-09-26 DIAGNOSIS — I10 Essential (primary) hypertension: Secondary | ICD-10-CM | POA: Diagnosis not present

## 2015-09-26 DIAGNOSIS — N189 Chronic kidney disease, unspecified: Secondary | ICD-10-CM | POA: Diagnosis not present

## 2015-09-26 DIAGNOSIS — R319 Hematuria, unspecified: Secondary | ICD-10-CM | POA: Diagnosis not present

## 2015-09-26 DIAGNOSIS — D649 Anemia, unspecified: Secondary | ICD-10-CM | POA: Diagnosis not present

## 2015-10-07 ENCOUNTER — Other Ambulatory Visit (HOSPITAL_COMMUNITY): Payer: Self-pay | Admitting: Medical

## 2015-10-07 DIAGNOSIS — N183 Chronic kidney disease, stage 3 unspecified: Secondary | ICD-10-CM

## 2015-10-17 ENCOUNTER — Ambulatory Visit (HOSPITAL_COMMUNITY)
Admission: RE | Admit: 2015-10-17 | Discharge: 2015-10-17 | Disposition: A | Discharge status: Home / Self Care | Payer: Medicare Other | Source: Ambulatory Visit | Attending: Medical | Admitting: Medical

## 2015-10-17 DIAGNOSIS — E559 Vitamin D deficiency, unspecified: Secondary | ICD-10-CM | POA: Diagnosis not present

## 2015-10-17 DIAGNOSIS — R93421 Abnormal radiologic findings on diagnostic imaging of right kidney: Secondary | ICD-10-CM | POA: Diagnosis not present

## 2015-10-17 DIAGNOSIS — N183 Chronic kidney disease, stage 3 unspecified: Secondary | ICD-10-CM

## 2015-10-17 DIAGNOSIS — N1330 Unspecified hydronephrosis: Secondary | ICD-10-CM | POA: Insufficient documentation

## 2015-10-17 DIAGNOSIS — R93422 Abnormal radiologic findings on diagnostic imaging of left kidney: Secondary | ICD-10-CM | POA: Diagnosis not present

## 2015-10-17 DIAGNOSIS — Z79899 Other long term (current) drug therapy: Secondary | ICD-10-CM | POA: Diagnosis not present

## 2015-10-17 DIAGNOSIS — D509 Iron deficiency anemia, unspecified: Secondary | ICD-10-CM | POA: Diagnosis not present

## 2015-10-17 DIAGNOSIS — R809 Proteinuria, unspecified: Secondary | ICD-10-CM | POA: Diagnosis not present

## 2015-10-17 DIAGNOSIS — I10 Essential (primary) hypertension: Secondary | ICD-10-CM | POA: Diagnosis not present

## 2015-11-08 ENCOUNTER — Ambulatory Visit (INDEPENDENT_AMBULATORY_CARE_PROVIDER_SITE_OTHER): Payer: Medicare Other | Admitting: Urology

## 2015-11-08 DIAGNOSIS — N200 Calculus of kidney: Secondary | ICD-10-CM

## 2015-11-08 DIAGNOSIS — N1339 Other hydronephrosis: Secondary | ICD-10-CM | POA: Diagnosis not present

## 2015-11-15 ENCOUNTER — Ambulatory Visit (HOSPITAL_COMMUNITY): Payer: Medicare Other

## 2015-11-21 DIAGNOSIS — D519 Vitamin B12 deficiency anemia, unspecified: Secondary | ICD-10-CM | POA: Diagnosis not present

## 2015-11-21 DIAGNOSIS — E559 Vitamin D deficiency, unspecified: Secondary | ICD-10-CM | POA: Diagnosis not present

## 2015-11-21 DIAGNOSIS — E782 Mixed hyperlipidemia: Secondary | ICD-10-CM | POA: Diagnosis not present

## 2015-12-12 ENCOUNTER — Other Ambulatory Visit: Payer: Self-pay

## 2015-12-12 DIAGNOSIS — N1330 Unspecified hydronephrosis: Secondary | ICD-10-CM

## 2015-12-19 ENCOUNTER — Ambulatory Visit (HOSPITAL_COMMUNITY): Payer: Medicare Other | Admitting: Oncology

## 2015-12-19 DIAGNOSIS — R809 Proteinuria, unspecified: Secondary | ICD-10-CM | POA: Diagnosis not present

## 2015-12-19 DIAGNOSIS — D509 Iron deficiency anemia, unspecified: Secondary | ICD-10-CM | POA: Diagnosis not present

## 2015-12-19 DIAGNOSIS — E559 Vitamin D deficiency, unspecified: Secondary | ICD-10-CM | POA: Diagnosis not present

## 2015-12-19 DIAGNOSIS — I10 Essential (primary) hypertension: Secondary | ICD-10-CM | POA: Diagnosis not present

## 2015-12-19 DIAGNOSIS — Z79899 Other long term (current) drug therapy: Secondary | ICD-10-CM | POA: Diagnosis not present

## 2015-12-19 DIAGNOSIS — N183 Chronic kidney disease, stage 3 (moderate): Secondary | ICD-10-CM | POA: Diagnosis not present

## 2016-01-03 ENCOUNTER — Other Ambulatory Visit: Payer: Self-pay

## 2016-01-09 NOTE — Patient Instructions (Signed)
Your procedure is scheduled on: 01/15/2016  Report to [facility] at  56   AM.  Call this number if you have problems the morning of surgery: [phone]   Remember:   Do not drink or eat food:After Midnight.  :  Take these medicines the morning of surgery with A SIP OF WATER: Amlodipine, Pepcid and Isosorbide   Do not wear jewelry, make-up or nail polish.  Do not wear lotions, powders, or perfumes. You may wear deodorant.  Do not shave 48 hours prior to surgery. Men may shave face and neck.  Do not bring valuables to the hospital.  Contacts, dentures or bridgework may not be worn into surgery.  Leave suitcase in the car. After surgery it may be brought to your room.  For patients admitted to the hospital, checkout time is 11:00 AM the day of discharge.   Patients discharged the day of surgery will not be allowed to drive home.    Special Instructions: Shower using CHG night before surgery and shower the day of surgery use CHG.  Use special wash - you have one bottle of CHG for all showers.  You should use approximately 1/2 of the bottle for each shower.   Cystoscopy Cystoscopy is a procedure that is used to help your caregiver diagnose and sometimes treat conditions that affect your lower urinary tract. Your lower urinary tract includes your bladder and the tube through which urine passes from your bladder out of your body (urethra). Cystoscopy is performed with a thin, tube-shaped instrument (cystoscope). The cystoscope has lenses and a light at the end so that your caregiver can see inside your bladder. The cystoscope is inserted at the entrance of your urethra. Your caregiver guides it through your urethra and into your bladder. There are two main types of cystoscopy:  Flexible cystoscopy (with a flexible cystoscope).  Rigid cystoscopy (with a rigid cystoscope). Cystoscopy may be recommended for many conditions, including:  Urinary tract infections.  Blood in your urine  (hematuria).  Loss of bladder control (urinary incontinence) or overactive bladder.  Unusual cells found in a urine sample.  Urinary blockage.  Painful urination. Cystoscopy may also be done to remove a sample of your tissue to be checked under a microscope (biopsy). It may also be done to remove or destroy bladder stones. LET YOUR CAREGIVER KNOW ABOUT:  Allergies to food or medicine.  Medicines taken, including vitamins, herbs, eyedrops, over-the-counter medicines, and creams.  Use of steroids (by mouth or creams).  Previous problems with anesthetics or numbing medicines.  History of bleeding problems or blood clots.  Previous surgery.  Other health problems, including diabetes and kidney problems.  Possibility of pregnancy, if this applies. PROCEDURE The area around the opening to your urethra will be cleaned. A medicine to numb your urethra (local anesthetic) is used. If a tissue sample or stone is removed during the procedure, you may be given a medicine to make you sleep (general anesthetic). Your caregiver will gently insert the tip of the cystoscope into your urethra. The cystoscope will be slowly glided through your urethra and into your bladder. Sterile fluid will flow through the cystoscope and into your bladder. The fluid will expand and stretch your bladder. This gives your caregiver a better view of your bladder walls. The procedure lasts about 15-20 minutes. AFTER THE PROCEDURE If a local anesthetic is used, you will be allowed to go home as soon as you are ready. If a general anesthetic is used, you  will be taken to a recovery area until you are stable. You may have temporary bleeding and burning on urination.   This information is not intended to replace advice given to you by your health care provider. Make sure you discuss any questions you have with your health care provider.   Document Released: 04/17/2000 Document Revised: 05/11/2014 Document Reviewed:  10/12/2011 Elsevier Interactive Patient Education 2016 [place].  Cystoscopy, Care After Refer to this sheet in the next few weeks. These instructions provide you with information on caring for yourself after your procedure. Your caregiver may also give you more specific instructions. Your treatment has been planned according to current medical practices, but problems sometimes occur. Call your caregiver if you have any problems or questions after your procedure. HOME CARE INSTRUCTIONS  Things you can do to ease any discomfort after your procedure include:  Drinking enough water and fluids to keep your urine clear or pale yellow.  Taking a warm bath to relieve any burning feelings. SEEK IMMEDIATE MEDICAL CARE IF:   You have an increase in blood in your urine.  You notice blood clots in your urine.  You have difficulty passing urine.  You have the chills.  You have abdominal pain.  You have a fever or persistent symptoms for more than 2-3 days.  You have a fever and your symptoms suddenly get worse. MAKE SURE YOU:   Understand these instructions.  Will watch your condition.  Will get help right away if you are not doing well or get worse.   This information is not intended to replace advice given to you by your health care provider. Make sure you discuss any questions you have with your health care provider.   Document Released: 11/07/2004 Document Revised: 05/11/2014 Document Reviewed: 10/12/2011 Elsevier Interactive Patient Education 2016 Elsevier Inc.  Transurethral Resection, Bladder Tumor A cancerous growth (tumor) can develop on the inside wall of the bladder. The bladder is the organ that holds urine. One way to remove the tumor is a procedure called a transurethral resection. The tumor is removed (resected) through the tube that carries urine from the bladder out of the body (urethra). No cuts (incisions) are made in the skin. Instead, the procedure is done  through a thin telescope, called a resectoscope. Attached to it is a light and usually a tiny camera. The resectoscope is put into the urethra. In men, the urethra opens at the end of the penis. In women, it opens just above the vagina.  A transurethral resection is usually used to remove tumors that have not gotten too big or too deep. These are called Stage 0, Stage 1 or Stage 2 bladder cancers. LET YOUR CAREGIVER KNOW ABOUT:  On the day of the procedure, your caregivers will need to know the last time you had anything to eat or drink. This includes water, gum, and candy. In advance, make sure they know about:   Any allergies.  All medications you are taking, including:  Herbs, eyedrops, over-the-counter medications and creams.  Blood thinners (anticoagulants), aspirin or other drugs that could affect blood clotting.  Use of steroids (by mouth or as creams).  Previous problems with anesthetics, including local anesthetics.  Possibility of pregnancy, if this applies.  Any history of blood clots.  Any history of bleeding or other blood problems.  Previous surgery.  Smoking history.  Any recent symptoms of colds or infections.  Other health problems. RISKS AND COMPLICATIONS This is usually a safe procedure. Every procedure  has risks, though. For a transurethral resection, they include:  Infection. Antibiotic medication would need to be taken.  Bleeding.  Light bleeding may last for several days after the procedure.  If bleeding continues or is heavy, the bladder may need rinsing. Or, a new catheter might be put in for awhile.  Sometimes bed rest is needed.  Urination problems.  Pain and burning can occur when urinating. This usually goes away in a few days.  Scarring from the procedure can block the flow of urine.  Bladder damage.  It can be punctured or torn during removal of the tumor. If this happens, a catheter might be needed for longer. Antibiotics would be  taken while the bladder heals.  Urine can leak through the hole or tear into the abdomen. If this happens, surgery may be needed to repair the bladder. BEFORE THE PROCEDURE   A medical evaluation will be done. This may include:  A physical examination.  Urine test. This is to make sure you do not have a urinary tract infection.  Blood tests.  A test that checks the heart's rhythm (electrocardiogram).  Talking with an anesthesiologist. This is the person who will be in charge of the medication (anesthesia) to keep you from feeling pain during the transurethral resection. You might be asleep during the procedure (general anesthesia) or numb from the waist down, but awake during the procedure (spinal anesthesia). Ask your surgeon what to expect.  The person who is having a transurethral resection needs to give what is called informed consent. This requires signing a legal paper that gives permission for the procedure. To give informed consent:  You must understand how the procedure is done and why.  You must be told all the risks and benefits of the procedure.  You must sign the consent. Sometimes a legal guardian can do this.  Signing should be witnessed by a healthcare professional.  The day before the surgery, eat only a light dinner. Then, do not eat or drink anything for at least 8 hours before the surgery. Ask your caregiver if it is OK to take any needed medicines with a sip of water.  Arrive at least an hour before the surgery or whenever your surgeon recommends. This will give you time to check in and fill out any needed paperwork. PROCEDURE  The preparation:  You will change into a hospital gown.  A needle will be inserted in your arm. This is an intravenous access tube (IV). Medication will be able to flow directly into your body through this needle.  Small monitors will be put on your body. They are used to check your heart, blood pressure, and oxygen level.  You  might be given medication that will help you relax (sedative).  You will be given a general anesthetic or spinal anesthesia.  The procedure:  Once you are asleep or numb from the waist down, your legs will be placed in stirrups.  The resectoscope will be passed through the urethra into the bladder.  Fluid will be passed through the resectoscope. This will fill the bladder with water.  The surgeon will examine the bladder through the scope. If the scope has a camera, it can take pictures from inside the bladder. They can be projected onto a TV screen.  The surgeon will use various tools to remove the tumor in small pieces. Sometimes a laser (a beam of light energy) is used. Other tools may use electric current.  A tube (catheter) will often  be placed so that urine can drain into a bag outside the body. This process helps stop bleeding. This tube keeps blood clots from blocking the urethra.  The procedure usually takes 30 to 45 minutes. AFTER THE PROCEDURE   You will stay in a recovery area until the anesthesia has worn off. Your blood pressure and pulse will be checked every so often. Then you will be taken to a hospital room.  You may continue to get fluids through the IV for awhile.  Some pain is normal. The catheter might be uncomfortable. Pain is usually not severe. If it is, ask for pain medicine.  Your urine may look bloody after a transurethral resection. This is normal.  If bleeding is heavy, a hospital caregiver may rinse out the bladder (irrigation) through the catheter.  Once the urine is clear, the catheter will be taken out.  You will need to stay in the hospital until you can urinate on your own.  Most people stay in the hospital for up to 4 days. PROGNOSIS   Transurethral resection is considered the best way to treat bladder tumors that are not too far along. For most people, the treatment is successful. Sometimes, though, more treatment is needed.  Bladder  cancers can come back even after a successful procedure. Because of this, be sure to have a checkup with your caregiver every 3 to 6 months. If everything is OK for 3 years, you can reduce the checkups to once a year.   This information is not intended to replace advice given to you by your health care provider. Make sure you discuss any questions you have with your health care provider.   Document Released: 02/14/2009 Document Revised: 07/13/2011 Document Reviewed: 04/22/2009 Elsevier Interactive Patient Education [employer].

## 2016-01-10 ENCOUNTER — Other Ambulatory Visit: Payer: Self-pay

## 2016-01-10 ENCOUNTER — Encounter (HOSPITAL_COMMUNITY): Payer: Self-pay

## 2016-01-10 ENCOUNTER — Encounter (HOSPITAL_COMMUNITY)
Admission: RE | Admit: 2016-01-10 | Discharge: 2016-01-10 | Disposition: A | Discharge status: Home / Self Care | Payer: Medicare Other | Source: Ambulatory Visit | Attending: Urology | Admitting: Urology

## 2016-01-10 DIAGNOSIS — I10 Essential (primary) hypertension: Secondary | ICD-10-CM | POA: Insufficient documentation

## 2016-01-10 LAB — BASIC METABOLIC PANEL
ANION GAP: 5 (ref 5–15)
BUN: 45 mg/dL — ABNORMAL HIGH (ref 6–20)
CO2: 22 mmol/L (ref 22–32)
Calcium: 8.7 mg/dL — ABNORMAL LOW (ref 8.9–10.3)
Chloride: 112 mmol/L — ABNORMAL HIGH (ref 101–111)
Creatinine, Ser: 2.13 mg/dL — ABNORMAL HIGH (ref 0.61–1.24)
GFR calc Af Amer: 31 mL/min — ABNORMAL LOW (ref >60)
GFR calc non Af Amer: 26 mL/min — ABNORMAL LOW (ref >60)
GLUCOSE: 139 mg/dL — AB (ref 65–99)
Potassium: 3.9 mmol/L (ref 3.5–5.1)
Sodium: 139 mmol/L (ref 135–145)

## 2016-01-10 LAB — CBC
HCT: 27.6 % — ABNORMAL LOW (ref 39.0–52.0)
Hemoglobin: 9.4 g/dL — ABNORMAL LOW (ref 13.0–17.0)
MCH: 33.8 pg (ref 26.0–34.0)
MCHC: 34.1 g/dL (ref 30.0–36.0)
MCV: 99.3 fL (ref 78.0–100.0)
Platelets: 103 10*3/uL — ABNORMAL LOW (ref 150–400)
RBC: 2.78 MIL/uL — AB (ref 4.22–5.81)
RDW: 13.5 % (ref 11.5–15.5)
WBC: 5.5 10*3/uL (ref 4.0–10.5)

## 2016-01-15 ENCOUNTER — Ambulatory Visit (HOSPITAL_COMMUNITY): Payer: Medicare Other | Admitting: Anesthesiology

## 2016-01-15 ENCOUNTER — Encounter (HOSPITAL_COMMUNITY): Payer: Self-pay | Admitting: Certified Registered Nurse Anesthetist

## 2016-01-15 ENCOUNTER — Encounter (HOSPITAL_COMMUNITY)
Admission: RE | Disposition: A | Discharge status: Home / Self Care | Payer: Self-pay | Source: Ambulatory Visit | Attending: Urology

## 2016-01-15 ENCOUNTER — Ambulatory Visit (HOSPITAL_COMMUNITY)
Admission: RE | Admit: 2016-01-15 | Discharge: 2016-01-15 | Disposition: A | Discharge status: Home / Self Care | Payer: Medicare Other | Source: Ambulatory Visit | Attending: Urology | Admitting: Urology

## 2016-01-15 ENCOUNTER — Ambulatory Visit (HOSPITAL_COMMUNITY): Payer: Medicare Other

## 2016-01-15 DIAGNOSIS — I10 Essential (primary) hypertension: Secondary | ICD-10-CM | POA: Diagnosis not present

## 2016-01-15 DIAGNOSIS — C661 Malignant neoplasm of right ureter: Secondary | ICD-10-CM | POA: Insufficient documentation

## 2016-01-15 DIAGNOSIS — Z951 Presence of aortocoronary bypass graft: Secondary | ICD-10-CM | POA: Diagnosis not present

## 2016-01-15 DIAGNOSIS — Z7982 Long term (current) use of aspirin: Secondary | ICD-10-CM | POA: Insufficient documentation

## 2016-01-15 DIAGNOSIS — I2510 Atherosclerotic heart disease of native coronary artery without angina pectoris: Secondary | ICD-10-CM | POA: Diagnosis not present

## 2016-01-15 DIAGNOSIS — Z79899 Other long term (current) drug therapy: Secondary | ICD-10-CM | POA: Diagnosis not present

## 2016-01-15 DIAGNOSIS — K219 Gastro-esophageal reflux disease without esophagitis: Secondary | ICD-10-CM | POA: Insufficient documentation

## 2016-01-15 DIAGNOSIS — D4959 Neoplasm of unspecified behavior of other genitourinary organ: Secondary | ICD-10-CM | POA: Diagnosis not present

## 2016-01-15 DIAGNOSIS — D494 Neoplasm of unspecified behavior of bladder: Secondary | ICD-10-CM | POA: Diagnosis not present

## 2016-01-15 DIAGNOSIS — I350 Nonrheumatic aortic (valve) stenosis: Secondary | ICD-10-CM | POA: Insufficient documentation

## 2016-01-15 DIAGNOSIS — Z87891 Personal history of nicotine dependence: Secondary | ICD-10-CM | POA: Diagnosis not present

## 2016-01-15 DIAGNOSIS — Z955 Presence of coronary angioplasty implant and graft: Secondary | ICD-10-CM | POA: Insufficient documentation

## 2016-01-15 DIAGNOSIS — N1330 Unspecified hydronephrosis: Secondary | ICD-10-CM | POA: Diagnosis not present

## 2016-01-15 HISTORY — PX: BIOPSY: SHX5522

## 2016-01-15 HISTORY — PX: CYSTOSCOPY W/ RETROGRADES: SHX1426

## 2016-01-15 HISTORY — PX: CYSTOSCOPY WITH STENT PLACEMENT: SHX5790

## 2016-01-15 HISTORY — PX: URETEROSCOPY: SHX842

## 2016-01-15 SURGERY — CYSTOSCOPY, WITH RETROGRADE PYELOGRAM
Anesthesia: Spinal | Site: Ureter | Laterality: Right

## 2016-01-15 MED ORDER — FENTANYL CITRATE (PF) 100 MCG/2ML IJ SOLN
INTRAMUSCULAR | Status: AC
  Filled 2016-01-15: qty 2

## 2016-01-15 MED ORDER — PROPOFOL 10 MG/ML IV BOLUS
INTRAVENOUS | Status: AC
  Filled 2016-01-15: qty 20

## 2016-01-15 MED ORDER — CEFAZOLIN IN D5W 1 GM/50ML IV SOLN
INTRAVENOUS | Status: AC
  Filled 2016-01-15: qty 50

## 2016-01-15 MED ORDER — MIDAZOLAM HCL 5 MG/5ML IJ SOLN
INTRAMUSCULAR | Status: DC | PRN
  Administered 2016-01-15: 1 mg via INTRAVENOUS

## 2016-01-15 MED ORDER — ONDANSETRON HCL 4 MG/2ML IJ SOLN
INTRAMUSCULAR | Status: AC
  Filled 2016-01-15: qty 2

## 2016-01-15 MED ORDER — MIDAZOLAM HCL 2 MG/2ML IJ SOLN
INTRAMUSCULAR | Status: AC
  Filled 2016-01-15: qty 2

## 2016-01-15 MED ORDER — CHLORHEXIDINE GLUCONATE CLOTH 2 % EX PADS: 6.0000 | MEDICATED_PAD | Freq: Once | CUTANEOUS | Status: DC

## 2016-01-15 MED ORDER — LACTATED RINGERS IV SOLN
INTRAVENOUS | Status: DC
  Administered 2016-01-15 (×2): via INTRAVENOUS

## 2016-01-15 MED ORDER — FENTANYL CITRATE (PF) 100 MCG/2ML IJ SOLN
25.0000 ug | INTRAMUSCULAR | Status: DC | PRN
  Administered 2016-01-15: 25 ug via INTRAVENOUS

## 2016-01-15 MED ORDER — CEFAZOLIN IN D5W 1 GM/50ML IV SOLN
1.0000 g | INTRAVENOUS | Status: AC
  Administered 2016-01-15: 1 g via INTRAVENOUS

## 2016-01-15 MED ORDER — DIATRIZOATE MEGLUMINE 18 % UR SOLN: URETHRAL | Status: DC | PRN

## 2016-01-15 MED ORDER — EPHEDRINE SULFATE 50 MG/ML IJ SOLN
INTRAMUSCULAR | Status: AC
  Filled 2016-01-15: qty 1

## 2016-01-15 MED ORDER — TRAMADOL HCL 50 MG PO TABS: 50.0000 mg | ORAL_TABLET | Freq: Four times a day (QID) | ORAL | 0 refills | Status: DC | PRN

## 2016-01-15 MED ORDER — HYDROMORPHONE HCL 1 MG/ML IJ SOLN: 0.2500 mg | INTRAMUSCULAR | Status: DC | PRN

## 2016-01-15 MED ORDER — MIDAZOLAM HCL 2 MG/2ML IJ SOLN
1.0000 mg | INTRAMUSCULAR | Status: DC | PRN
  Administered 2016-01-15: 2 mg via INTRAVENOUS

## 2016-01-15 MED ORDER — DIATRIZOATE MEGLUMINE 30 % UR SOLN
URETHRAL | Status: DC | PRN
  Administered 2016-01-15: 30 mL via URETHRAL

## 2016-01-15 MED ORDER — SODIUM CHLORIDE 0.9 % IR SOLN
Status: DC | PRN
  Administered 2016-01-15: 3000 mL

## 2016-01-15 MED ORDER — PROPOFOL 500 MG/50ML IV EMUL
INTRAVENOUS | Status: DC | PRN
Start: 2016-01-15 — End: 2016-01-15
  Administered 2016-01-15: 25 ug/kg/min via INTRAVENOUS

## 2016-01-15 MED ORDER — LIDOCAINE HCL (PF) 1 % IJ SOLN
INTRAMUSCULAR | Status: AC
  Filled 2016-01-15: qty 5

## 2016-01-15 MED ORDER — EPHEDRINE SULFATE 50 MG/ML IJ SOLN
INTRAMUSCULAR | Status: DC | PRN
  Administered 2016-01-15: 10 mg via INTRAVENOUS
  Administered 2016-01-15 (×4): 5 mg via INTRAVENOUS

## 2016-01-15 MED ORDER — STERILE WATER FOR IRRIGATION IR SOLN
Status: DC | PRN
  Administered 2016-01-15: 3000 mL

## 2016-01-15 MED ORDER — DIATRIZOATE MEGLUMINE 30 % UR SOLN
URETHRAL | Status: AC
  Filled 2016-01-15: qty 300

## 2016-01-15 MED ORDER — FENTANYL CITRATE (PF) 100 MCG/2ML IJ SOLN
INTRAMUSCULAR | Status: DC | PRN
  Administered 2016-01-15: 25 ug via INTRATHECAL

## 2016-01-15 MED ORDER — BUPIVACAINE IN DEXTROSE 0.75-8.25 % IT SOLN
INTRATHECAL | Status: DC | PRN
  Administered 2016-01-15: 1.5 mL via INTRATHECAL

## 2016-01-15 SURGICAL SUPPLY — 32 items
BAG DRAIN URO TABLE W/ADPT NS (DRAPE) ×5 IMPLANT
BAG DRN 8 ADPR NS SKTRN CSTL (DRAPE) ×3
BAG HAMPER (MISCELLANEOUS) ×5 IMPLANT
CLOTH BEACON ORANGE TIMEOUT ST (SAFETY) ×5 IMPLANT
ELECT LOOP 22F BIPOLAR SML (ELECTROSURGICAL) ×5
ELECT REM PT RETURN 9FT ADLT (ELECTROSURGICAL) ×5
ELECTRODE LOOP 22F BIPOLAR SML (ELECTROSURGICAL) ×3 IMPLANT
ELECTRODE REM PT RTRN 9FT ADLT (ELECTROSURGICAL) ×3 IMPLANT
GLOVE BIO SURGEON STRL SZ7.5 (GLOVE) ×5 IMPLANT
GLOVE BIOGEL PI IND STRL 6.5 (GLOVE) ×2 IMPLANT
GLOVE BIOGEL PI IND STRL 7.0 (GLOVE) ×1 IMPLANT
GLOVE BIOGEL PI INDICATOR 6.5 (GLOVE) ×2
GLOVE BIOGEL PI INDICATOR 7.0 (GLOVE) ×2
GLOVE ECLIPSE 6.5 STRL STRAW (GLOVE) ×8 IMPLANT
GLOVE EXAM NITRILE PF MED BLUE (GLOVE) ×3 IMPLANT
GLOVE SS BIOGEL STRL SZ 7.5 (GLOVE) ×1 IMPLANT
GLOVE SUPERSENSE BIOGEL SZ 7.5 (GLOVE) ×2
GOWN STRL REUS W/ TWL LRG LVL3 (GOWN DISPOSABLE) ×6 IMPLANT
GOWN STRL REUS W/ TWL XL LVL3 (GOWN DISPOSABLE) ×3 IMPLANT
GOWN STRL REUS W/TWL LRG LVL3 (GOWN DISPOSABLE) ×10
GOWN STRL REUS W/TWL XL LVL3 (GOWN DISPOSABLE) ×5
GUIDEWIRE ANG ZIPWIRE 038X150 (WIRE) ×5 IMPLANT
GUIDEWIRE STR DUAL SENSOR (WIRE) ×4 IMPLANT
IV NS IRRIG 3000ML ARTHROMATIC (IV SOLUTION) ×5 IMPLANT
KIT ROOM TURNOVER AP CYSTO (KITS) ×5 IMPLANT
MANIFOLD NEPTUNE II (INSTRUMENTS) ×3 IMPLANT
PACK CYSTO (CUSTOM PROCEDURE TRAY) ×5 IMPLANT
PAD ARMBOARD 7.5X6 YLW CONV (MISCELLANEOUS) ×5 IMPLANT
STENT URET 6FRX26 CONTOUR (STENTS) ×4 IMPLANT
SYR 20CC LL (SYRINGE) ×3 IMPLANT
SYRINGE IRR TOOMEY STRL 70CC (SYRINGE) ×3 IMPLANT
WATER STERILE IRR 3000ML UROMA (IV SOLUTION) ×3 IMPLANT

## 2016-01-15 NOTE — Brief Op Note (Signed)
01/15/2016  2:45 PM  PATIENT:  Elmer Bales Mcbrearty  82 y.o. male  PRE-OPERATIVE DIAGNOSIS:  right hydronephrosis  POST-OPERATIVE DIAGNOSIS:  right hydronephrosis, right ureteral tumor , prostate t  PROCEDURE:  Procedure(s): CYSTOSCOPY WITH RIGHT RETROGRADE PYELOGRAM (Right) CYSTOSCOPY WITH RIGHT STENT PLACEMENT (Right)  SURGEON:  Surgeon(s) and Role:    * Cleon Gustin, MD - Primary  PHYSICIAN ASSISTANT:   ASSISTANTS: none   ANESTHESIA:   spinal  EBL:  Total I/O In: 1000 [I.V.:1000] Out: -   BLOOD ADMINISTERED:none  DRAINS: right 6x26 JJ ureteral stent without tether   LOCAL MEDICATIONS USED:  NONE  SPECIMEN:  Source of Specimen:  right ureteral tumor, prostatic urethra tumor  DISPOSITION OF SPECIMEN:  PATHOLOGY  COUNTS:  YES  TOURNIQUET:  * No tourniquets in log *  DICTATION: .Note written in EPIC  PLAN OF CARE: Discharge to home after PACU  PATIENT DISPOSITION:  PACU - hemodynamically stable.   Delay start of Pharmacological VTE agent (>24hrs) due to surgical blood loss or risk of bleeding: not applicable

## 2016-01-15 NOTE — H&P (Signed)
Urology Admission H&P  Chief Complaint: right hydronephrosis  History of Present Illness: Mr Brian [name] is a 82yo who was found to havge right hydronephrosis and a possible bladder and ureteral mass on CT. No hematuria. Hx of tobacco abuse  Past Medical History:  Diagnosis Date  . Aortic stenosis 3/11   Very mild, possibly bicuspid valve  . Coronary atherosclerosis of native coronary artery 5/11   Multivessel, DES circumflex 5/11, 50% LAD, occluded RCA, LVEF 60%  . Essential hypertension, benign    Past Surgical History:  Procedure Laterality Date  . CATARACT EXTRACTION     BILATERAL  . CORONARY ANGIOPLASTY     stent   4 years ago  . CORONARY ARTERY BYPASS GRAFT      Home Medications:  Prescriptions Prior to Admission  Medication Sig Dispense Refill Last Dose  . amLODipine (NORVASC) 10 MG tablet Take 10 mg by mouth daily.   Taking  . aspirin 81 MG tablet Take 81 mg by mouth daily.     Taking  . atorvastatin (LIPITOR) 40 MG tablet Take 1 tablet (40 mg total) by mouth daily. 90 tablet 3   . famotidine (PEPCID) 20 MG tablet Take 20 mg by mouth daily as needed for heartburn or indigestion.    Taking  . irbesartan (AVAPRO) 300 MG tablet Take 300 mg by mouth at bedtime.     Taking  . isosorbide mononitrate (IMDUR) 30 MG 24 hr tablet take 1 tablet by mouth once daily 90 tablet 3   . nitroGLYCERIN (NITROSTAT) 0.4 MG SL tablet Place 0.4 mg under the tongue every 5 (five) minutes as needed. TAKE AS DIRECTED FOR CHEST PAIN, NOT TO EXCEED 3 DOSES.    Taking  . valsartan (DIOVAN) 320 MG tablet Take 320 mg by mouth daily.      Allergies:  Allergies  Allergen Reactions  . Codeine     REACTION: nausea    No family history on file. Social History:  reports that he quit smoking about 27 years ago. His smoking use included Cigarettes. He has a 50.00 pack-year smoking history. He has never used smokeless tobacco. He reports that he does not drink alcohol or use drugs.  Review of Systems  All  other systems reviewed and are negative.   Physical Exam:  Vital signs in last 24 hours: Temp:  [97.5 F (36.4 C)] 97.5 F (36.4 C) (09/13 1108) Pulse Rate:  [63] 63 (09/13 1108) Resp:  [0-16] 10 (09/13 1215) BP: (86-145)/(50-66) 86/54 (09/13 1215) SpO2:  [94 %-98 %] 98 % (09/13 1215) Physical Exam  Constitutional: He is oriented to person, place, and time. He appears well-developed and well-nourished.  HENT:  Head: Normocephalic and atraumatic.  Eyes: EOM are normal. Pupils are equal, round, and reactive to light.  Neck: Normal range of motion. No thyromegaly present.  Cardiovascular: Normal rate and regular rhythm.   Respiratory: Effort normal. No respiratory distress.  GI: Soft. He exhibits no distension.  Musculoskeletal: Normal range of motion.  Neurological: He is alert and oriented to person, place, and time.  Skin: Skin is warm and dry.  Psychiatric: He has a normal mood and affect. His behavior is normal. Judgment and thought content normal.    Laboratory Data:  No results found for this or any previous visit (from the past 24 hour(s)). No results found for this or any previous visit (from the past 240 hour(s)). Creatinine:  Recent Labs  01/10/16 1009  CREATININE 2.13*   Baseline Creatinine: unkinwon  Impression/Assessment:  82yo with right hydronephrosis, possible ureteral mass  Plan:  The risks/benefits/alternatives to TURBt and right ureteroscopy was explained to the patient and he understands and wishes to proceed with surgery  Nicolette Bang 01/15/2016, 12:56 PM

## 2016-01-15 NOTE — Transfer of Care (Signed)
Immediate Anesthesia Transfer of Care Note  Patient: Brian [name]  Procedure(s) Performed: Procedure(s): CYSTOSCOPY WITH RIGHT RETROGRADE PYELOGRAM (Right) CYSTOSCOPY  WITH RIGHT STENT PLACEMENT (Right) BLADDER BIOPSY WITH FULGERATION (N/A) RIGHT URETEROSCOPY WITH BIOPSY (Right)  Patient Location: PACU  Anesthesia Type:Spinal  Level of Consciousness: awake, alert , oriented, patient cooperative and responds to stimulation  Airway & Oxygen Therapy: Patient Spontanous Breathing and Patient connected to nasal cannula oxygen  Post-op Assessment: Report given to RN and Post -op Vital signs reviewed and stable  Post vital signs: Reviewed and stable  Last Vitals:  Vitals:   01/15/16 1210 01/15/16 1215  BP: (!) 110/58 (!) 86/54  Pulse:    Resp: 14 10  Temp:      Last Pain:  Vitals:   01/15/16 1108  TempSrc: Oral      Patients Stated Pain Goal: 5 ([id number])  Complications: No apparent anesthesia complications

## 2016-01-15 NOTE — Anesthesia Preprocedure Evaluation (Signed)
Anesthesia Evaluation  Patient identified by MRN, date of birth, ID band Patient awake    Reviewed: Allergy & Precautions, NPO status , Patient's Chart, lab work & pertinent test results  Airway Mallampati: I  TM Distance: >3 FB     Dental  (+) Edentulous Lower, Edentulous Upper   Pulmonary former smoker,    breath sounds clear to auscultation       Cardiovascular hypertension, Pt. on medications (-) angina+ CAD, + Cardiac Stents and + CABG  + Valvular Problems/Murmurs AS  Rhythm:Regular Rate:Normal     Neuro/Psych    GI/Hepatic negative GI ROS, GERD  Controlled and Medicated,  Endo/Other    Renal/GU      Musculoskeletal   Abdominal   Peds  Hematology  (+) anemia ,   Anesthesia Other Findings   Reproductive/Obstetrics                             Anesthesia Physical Anesthesia Plan  ASA: III  Anesthesia Plan: Spinal   Post-op Pain Management:    Induction: Intravenous  Airway Management Planned: Simple Face Mask  Additional Equipment:   Intra-op Plan:   Post-operative Plan:   Informed Consent: I have reviewed the patients History and Physical, chart, labs and discussed the procedure including the risks, benefits and alternatives for the proposed anesthesia with the patient or authorized representative who has indicated his/her understanding and acceptance.     Plan Discussed with:   Anesthesia Plan Comments:         Anesthesia Quick Evaluation

## 2016-01-15 NOTE — Anesthesia Postprocedure Evaluation (Signed)
Anesthesia Post Note  Patient: Brian [name]  Procedure(s) Performed: Procedure(s) (LRB): CYSTOSCOPY WITH RIGHT RETROGRADE PYELOGRAM (Right) CYSTOSCOPY  WITH RIGHT STENT PLACEMENT (Right) BLADDER BIOPSY WITH FULGERATION (N/A) RIGHT URETEROSCOPY WITH BIOPSY (Right)  Patient location during evaluation: PACU Anesthesia Type: Spinal Level of consciousness: awake, oriented, patient cooperative, awake and alert and responds to stimulation Pain management: pain level controlled Vital Signs Assessment: post-procedure vital signs reviewed and stable Respiratory status: spontaneous breathing and patient connected to nasal cannula oxygen Cardiovascular status: blood pressure returned to baseline Postop Assessment: no headache and no signs of nausea or vomiting Anesthetic complications: no    Last Vitals:  Vitals:   01/15/16 1210 01/15/16 1215  BP: (!) 110/58 (!) 86/54  Pulse:    Resp: 14 10  Temp:      Last Pain:  Vitals:   01/15/16 1108  TempSrc: Oral                 Letonya Mangels

## 2016-01-15 NOTE — Progress Notes (Signed)
Awake. Peanut butter crackers and sprite given to eat. Tolerated well.

## 2016-01-15 NOTE — Anesthesia Procedure Notes (Addendum)
Spinal  Patient location during procedure: OR Start time: 01/15/2016 1:50 PM Staffing Resident/CRNA: Tressie Stalker E Preanesthetic Checklist Completed: patient identified, site marked, surgical consent, pre-op evaluation, timeout performed, IV checked, risks and benefits discussed and monitors and equipment checked Spinal Block Patient position: right lateral decubitus Prep: Betadine Patient monitoring: heart rate, cardiac monitor, continuous pulse ox and blood pressure Approach: right paramedian Location: L3-4 Injection technique: single-shot Needle Needle type: Spinocan  Needle gauge: 22 G Needle length: 9 cm Assessment Sensory level: T8 Additional Notes ATTEMPTS:2 TRAY [id number] TRAY EXPIRATION DATE:03/2017

## 2016-01-15 NOTE — Progress Notes (Signed)
RX for ultram given to son. Instructed him to take RX to pharmacy so would be ready for pick-up when pt is d/c. Voiced understanding.

## 2016-01-19 NOTE — Op Note (Signed)
Preoperative diagnosis: Right hydronephrosis  Postoperative diagnosis: Same  Procedure: 1 cystoscopy 2.  right retrograde pyelography 3.  Intraoperative fluoroscopy, under one hour, with interpretation 4.  Right diagnostic ureteroscopy 5. Right ureteral biopsy 6. Bladder biopsy 7. 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 .  Indications: Patient is a 82 year old male with a history of hydronephrosis.  After discussing treatment options, she decided proceed with right diagnostic ureteroscopy  Procedure her in detail: The patient was brought to the operating room and a brief timeout was done to ensure correct patient, correct procedure, correct site.  General anesthesia was administered patient was placed in dorsal lithotomy position.  Her genitalia was then prepped and draped in usual sterile fashion.  A rigid 29 French cystoscope was passed in the urethra and the bladder.  Bladder was inspected free masses or lesions.  the right ureteral orifices were in the normal orthotopic locations.  a 6 french ureteral catheter was then instilled into the right ureter orifice.  a gentle retrograde was obtained and findings noted above.  we then placed a zip wire through the ureteral catheter and advanced up to the renal pelvis.  we then removed the cystoscope and cannulated the right ureteral orifice with a semirigid ureteroscope.  we then performed ureteroscopy up to the level of the UPJ. A tumor was encountered at the uvj and using a biopsy grasper several biopsies were obtained and sent for pathology. We then placed a stent over the original zip wire. A 6x26 JJ stent was  advanced up to the renal pelvis. The wire was removed and good coil was noted in the renal pelvic under fluoroscopy and the bladder under direct vision. We then obtained a bladder neck biopsy for a bladder lesion. Hemostasis was then obtained with electrocautery. the bladder was then drained and this concluded the procedure which was well tolerated by patient.  Complications: None  Condition: Stable, extubated, transferred to PACU  Plan: Pt is to followup in 2 weeks for pathology discussion

## 2016-01-20 ENCOUNTER — Encounter (HOSPITAL_COMMUNITY): Payer: Self-pay | Admitting: Urology

## 2016-01-29 ENCOUNTER — Ambulatory Visit (INDEPENDENT_AMBULATORY_CARE_PROVIDER_SITE_OTHER): Payer: Medicare Other | Admitting: Urology

## 2016-01-29 DIAGNOSIS — C669 Malignant neoplasm of unspecified ureter: Secondary | ICD-10-CM | POA: Diagnosis not present

## 2016-01-30 ENCOUNTER — Ambulatory Visit (HOSPITAL_COMMUNITY): Payer: Medicare Other | Admitting: Oncology

## 2016-02-04 ENCOUNTER — Telehealth: Payer: Self-pay | Admitting: Radiology

## 2016-02-04 NOTE — Telephone Encounter (Signed)
Notified pt of surgery scheduled with Dr Alyson Ingles at [facility] on 02/19/16, pre-op appt on 02/14/16 @10 :00 & post-op appt with Dr Alyson Ingles on 03/04/16 @3 :00. Pt voices understanding.

## 2016-02-13 ENCOUNTER — Other Ambulatory Visit: Payer: Self-pay | Admitting: Radiology

## 2016-02-13 DIAGNOSIS — C661 Malignant neoplasm of right ureter: Secondary | ICD-10-CM

## 2016-02-13 NOTE — Patient Instructions (Signed)
Brian [name]  02/13/2016     @PREFPERIOPPHARMACY @   Your procedure is scheduled on 02/19/2016   Report to [facility] at  1100  A.M.  Call this number if you have problems the morning of surgery:  [phone]   Remember:  Do not eat food or drink liquids after midnight.  Take these medicines the morning of surgery with A SIP OF WATER  Amlodipine, pepcid, imdur, ultram, diovan.   Do not wear jewelry, make-up or nail polish.  Do not wear lotions, powders, or perfumes, or deoderant.  Do not shave 48 hours prior to surgery.  Men may shave face and neck.  Do not bring valuables to the hospital.  [facility] is not responsible for any belongings or valuables.  Contacts, dentures or bridgework may not be worn into surgery.  Leave your suitcase in the car.  After surgery it may be brought to your room.  For patients admitted to the hospital, discharge time will be determined by your treatment team.  Patients discharged the day of surgery will not be allowed to drive home.   Name and phone number of your driver:   family Special instructions:  none  Please read over the following fact sheets that you were given. Anesthesia Post-op Instructions and Care and Recovery After Surgery       Ureteral Stent Implantation Ureteral stent implantation is the implantation of a soft plastic tube with multiple holes into the tube that drains urine from your kidney to your bladder (ureter). The stent helps drain your kidney when there is a blockage of the flow of urine in your ureter. The stent has a coil on each end to keep it from falling out. One end stays in the kidney. The other end stays in the bladder. It is most often taken out after any blockage has been removed or your ureter has healed. Short-term stents have a string attached to make removal quite easy. Removal of a short-term stent can be done in your health care provider's office or by you at home.  Long-term stents need to be changed every few months. LET [facility] CARE PROVIDER KNOW ABOUT:  Any allergies you have.  All medicines you are taking, including vitamins, herbs, eye drops, creams, and over-the-counter medicines.  Previous problems you or members of your family have had with the use of anesthetics.  Any blood disorders you have.  Previous surgeries you have had.  Medical conditions you have. RISKS AND COMPLICATIONS Generally, ureteral stent implantation is a safe procedure. However, as with any procedure, complications can occur. Possible complications include:  Movement of the stent away from where it was originally placed (migration). This may affect the ability of the stent to properly drain your kidney. If migration of the stent occurs, the stent may need to be replaced or repositioned.  Perforation of the ureter.  Infection. BEFORE THE PROCEDURE  You may be asked to wash your genital area with sterile soap the morning of your procedure.  You may be given an oral antibiotic which you should take with a sip of water as prescribed by your health care provider.  You may be asked to not eat or drink for 8 hours before the surgery. PROCEDURE  First you will be given an anesthetic so you do not feel pain during the procedure.  Your health care provider will insert a special lighted instrument  called a cystoscope into your bladder. This allows your health care provider to see the opening to your ureter.  A thin wire is carefully threaded into your bladder and up the ureter. The stent is inserted over the wire and the wire is then removed.  Your bladder will be emptied of urine. AFTER THE PROCEDURE You will be taken to a recovery room until it is okay for you to go home.   This information is not intended to replace advice given to you by your health care provider. Make sure you discuss any questions you have with your health care provider.   Document  Released: 04/17/2000 Document Revised: 04/25/2013 Document Reviewed: 11/02/2014 Elsevier Interactive Patient Education 2016 [employer] Ureteral Stent Implantation, Care After Refer to this sheet in the next few weeks. These instructions provide you with information on caring for yourself after your procedure. Your health care provider may also give you more specific instructions. Your treatment has been planned according to current medical practices, but problems sometimes occur. Call your health care provider if you have any problems or questions after your procedure. WHAT TO EXPECT AFTER THE PROCEDURE You should be back to normal activity within 48 hours after the procedure. Nausea and vomiting may occur and are commonly the result of anesthesia. It is common to experience sharp pain in the back or lower abdomen and penis with voiding. This is caused by movement of the ends of the stent with the act of urinating.It usually goes away within minutes after you have stopped urinating. HOME CARE INSTRUCTIONS Make sure to drink plenty of fluids. You may have small amounts of bleeding, causing your urine to be red. This is normal. Certain movements may trigger pain or a feeling that you need to urinate. You may be given medicines to prevent infection or bladder spasms. Be sure to take all medicines as directed. Only take over-the-counter or prescription medicines for pain, discomfort, or fever as directed by your health care provider. Do not take aspirin, as this can make bleeding worse. Your stent will be left in until the blockage is resolved. This may take 2 weeks or longer, depending on the reason for stent implantation. You may have an X-ray exam to make sure your ureter is open and that the stent has not moved out of position (migrated). The stent can be removed by your health care provider in the office. Medicines may be given for comfort while the stent is being removed. Be sure to keep all  follow-up appointments so your health care provider can check that you are healing properly. SEEK MEDICAL CARE IF:  You experience increasing pain.  Your pain medicine is not working. SEEK IMMEDIATE MEDICAL CARE IF:  Your urine is dark red or has blood clots.  You are leaking urine (incontinent).  You have a fever, chills, feeling sick to your stomach (nausea), or vomiting.  Your pain is not relieved by pain medicine.  The end of the stent comes out of the urethra.  You are unable to urinate.   This information is not intended to replace advice given to you by your health care provider. Make sure you discuss any questions you have with your health care provider.   Document Released: 12/21/2012 Document Revised: 04/25/2013 Document Reviewed: 11/02/2014 Elsevier Interactive Patient Education [employer].  Cystoscopy Cystoscopy is a procedure that is used to help your caregiver diagnose and sometimes treat conditions that affect your lower urinary tract. Your lower urinary tract includes  your bladder and the tube through which urine passes from your bladder out of your body (urethra). Cystoscopy is performed with a thin, tube-shaped instrument (cystoscope). The cystoscope has lenses and a light at the end so that your caregiver can see inside your bladder. The cystoscope is inserted at the entrance of your urethra. Your caregiver guides it through your urethra and into your bladder. There are two main types of cystoscopy:  Flexible cystoscopy (with a flexible cystoscope).  Rigid cystoscopy (with a rigid cystoscope). Cystoscopy may be recommended for many conditions, including:  Urinary tract infections.  Blood in your urine (hematuria).  Loss of bladder control (urinary incontinence) or overactive bladder.  Unusual cells found in a urine sample.  Urinary blockage.  Painful urination. Cystoscopy may also be done to remove a sample of your tissue to be checked under a  microscope (biopsy). It may also be done to remove or destroy bladder stones. LET YOUR CAREGIVER KNOW ABOUT:  Allergies to food or medicine.  Medicines taken, including vitamins, herbs, eyedrops, over-the-counter medicines, and creams.  Use of steroids (by mouth or creams).  Previous problems with anesthetics or numbing medicines.  History of bleeding problems or blood clots.  Previous surgery.  Other health problems, including diabetes and kidney problems.  Possibility of pregnancy, if this applies. PROCEDURE The area around the opening to your urethra will be cleaned. A medicine to numb your urethra (local anesthetic) is used. If a tissue sample or stone is removed during the procedure, you may be given a medicine to make you sleep (general anesthetic). Your caregiver will gently insert the tip of the cystoscope into your urethra. The cystoscope will be slowly glided through your urethra and into your bladder. Sterile fluid will flow through the cystoscope and into your bladder. The fluid will expand and stretch your bladder. This gives your caregiver a better view of your bladder walls. The procedure lasts about 15-20 minutes. AFTER THE PROCEDURE If a local anesthetic is used, you will be allowed to go home as soon as you are ready. If a general anesthetic is used, you will be taken to a recovery area until you are stable. You may have temporary bleeding and burning on urination.   This information is not intended to replace advice given to you by your health care provider. Make sure you discuss any questions you have with your health care provider.   Document Released: 04/17/2000 Document Revised: 05/11/2014 Document Reviewed: 10/12/2011 Elsevier Interactive Patient Education 2016 [employer]. Cystoscopy, Care After Refer to this sheet in the next few weeks. These instructions provide you with information on caring for yourself after your procedure. Your caregiver may also give you  more specific instructions. Your treatment has been planned according to current medical practices, but problems sometimes occur. Call your caregiver if you have any problems or questions after your procedure. HOME CARE INSTRUCTIONS  Things you can do to ease any discomfort after your procedure include:  Drinking enough water and fluids to keep your urine clear or pale yellow.  Taking a warm bath to relieve any burning feelings. SEEK IMMEDIATE MEDICAL CARE IF:   You have an increase in blood in your urine.  You notice blood clots in your urine.  You have difficulty passing urine.  You have the chills.  You have abdominal pain.  You have a fever or persistent symptoms for more than 2-3 days.  You have a fever and your symptoms suddenly get worse. MAKE SURE YOU:  Understand these instructions.  Will watch your condition.  Will get help right away if you are not doing well or get worse.   This information is not intended to replace advice given to you by your health care provider. Make sure you discuss any questions you have with your health care provider.   Document Released: 11/07/2004 Document Revised: 05/11/2014 Document Reviewed: 10/12/2011 Elsevier Interactive Patient Education 2016 Elsevier Inc. PATIENT INSTRUCTIONS POST-ANESTHESIA  IMMEDIATELY FOLLOWING SURGERY:  Do not drive or operate machinery for the first twenty four hours after surgery.  Do not make any important decisions for twenty four hours after surgery or while taking narcotic pain medications or sedatives.  If you develop intractable nausea and vomiting or a severe headache please notify your doctor immediately.  FOLLOW-UP:  Please make an appointment with your surgeon as instructed. You do not need to follow up with anesthesia unless specifically instructed to do so.  WOUND CARE INSTRUCTIONS (if applicable):  Keep a dry clean dressing on the anesthesia/puncture wound site if there is drainage.  Once the  wound has quit draining you may leave it open to air.  Generally you should leave the bandage intact for twenty four hours unless there is drainage.  If the epidural site drains for more than 36-48 hours please call the anesthesia department.  QUESTIONS?:  Please feel free to call your physician or the hospital operator if you have any questions, and they will be happy to assist you.

## 2016-02-14 ENCOUNTER — Encounter (HOSPITAL_COMMUNITY): Payer: Self-pay

## 2016-02-14 ENCOUNTER — Encounter (HOSPITAL_COMMUNITY)
Admission: RE | Admit: 2016-02-14 | Discharge: 2016-02-14 | Disposition: A | Discharge status: Home / Self Care | Payer: Medicare Other | Source: Ambulatory Visit | Attending: Urology | Admitting: Urology

## 2016-02-14 DIAGNOSIS — Z01818 Encounter for other preprocedural examination: Secondary | ICD-10-CM | POA: Diagnosis not present

## 2016-02-14 HISTORY — DX: Gastro-esophageal reflux disease without esophagitis: K21.9

## 2016-02-14 LAB — BASIC METABOLIC PANEL
Anion gap: 6 (ref 5–15)
BUN: 41 mg/dL — AB (ref 6–20)
CHLORIDE: 109 mmol/L (ref 101–111)
CO2: 22 mmol/L (ref 22–32)
Calcium: 8.9 mg/dL (ref 8.9–10.3)
Creatinine, Ser: 2.17 mg/dL — ABNORMAL HIGH (ref 0.61–1.24)
GFR calc Af Amer: 30 mL/min — ABNORMAL LOW (ref >60)
GFR calc non Af Amer: 26 mL/min — ABNORMAL LOW (ref >60)
GLUCOSE: 140 mg/dL — AB (ref 65–99)
POTASSIUM: 4.3 mmol/L (ref 3.5–5.1)
Sodium: 137 mmol/L (ref 135–145)

## 2016-02-14 LAB — CBC WITH DIFFERENTIAL/PLATELET
Basophils Absolute: 0 10*3/uL (ref 0.0–0.1)
Basophils Relative: 1 %
Eosinophils Absolute: 0.2 10*3/uL (ref 0.0–0.7)
Eosinophils Relative: 4 %
HCT: 25.6 % — ABNORMAL LOW (ref 39.0–52.0)
HEMOGLOBIN: 8.6 g/dL — AB (ref 13.0–17.0)
LYMPHS ABS: 1.9 10*3/uL (ref 0.7–4.0)
LYMPHS PCT: 39 %
MCH: 34.1 pg — AB (ref 26.0–34.0)
MCHC: 33.6 g/dL (ref 30.0–36.0)
MCV: 101.6 fL — AB (ref 78.0–100.0)
Monocytes Absolute: 0.4 10*3/uL (ref 0.1–1.0)
Monocytes Relative: 8 %
NEUTROS PCT: 49 %
Neutro Abs: 2.5 10*3/uL (ref 1.7–7.7)
Platelets: 100 10*3/uL — ABNORMAL LOW (ref 150–400)
RBC: 2.52 MIL/uL — AB (ref 4.22–5.81)
RDW: 14.3 % (ref 11.5–15.5)
WBC: 5 10*3/uL (ref 4.0–10.5)

## 2016-02-19 ENCOUNTER — Encounter (HOSPITAL_COMMUNITY)
Admission: RE | Disposition: A | Discharge status: Home / Self Care | Payer: Self-pay | Source: Ambulatory Visit | Attending: Urology

## 2016-02-19 ENCOUNTER — Ambulatory Visit (HOSPITAL_COMMUNITY): Payer: Medicare Other | Admitting: Anesthesiology

## 2016-02-19 ENCOUNTER — Ambulatory Visit (HOSPITAL_COMMUNITY)
Admission: RE | Admit: 2016-02-19 | Discharge: 2016-02-19 | Disposition: A | Discharge status: Home / Self Care | Payer: Medicare Other | Source: Ambulatory Visit | Attending: Urology | Admitting: Urology

## 2016-02-19 ENCOUNTER — Ambulatory Visit (HOSPITAL_COMMUNITY): Payer: Medicare Other

## 2016-02-19 ENCOUNTER — Encounter (HOSPITAL_COMMUNITY): Payer: Self-pay | Admitting: *Deleted

## 2016-02-19 DIAGNOSIS — I10 Essential (primary) hypertension: Secondary | ICD-10-CM | POA: Diagnosis not present

## 2016-02-19 DIAGNOSIS — R310 Gross hematuria: Secondary | ICD-10-CM | POA: Diagnosis present

## 2016-02-19 DIAGNOSIS — D4121 Neoplasm of uncertain behavior of right ureter: Secondary | ICD-10-CM | POA: Diagnosis not present

## 2016-02-19 DIAGNOSIS — Z955 Presence of coronary angioplasty implant and graft: Secondary | ICD-10-CM | POA: Insufficient documentation

## 2016-02-19 DIAGNOSIS — K219 Gastro-esophageal reflux disease without esophagitis: Secondary | ICD-10-CM | POA: Insufficient documentation

## 2016-02-19 DIAGNOSIS — N1330 Unspecified hydronephrosis: Secondary | ICD-10-CM | POA: Insufficient documentation

## 2016-02-19 DIAGNOSIS — Z87891 Personal history of nicotine dependence: Secondary | ICD-10-CM | POA: Insufficient documentation

## 2016-02-19 DIAGNOSIS — Z79899 Other long term (current) drug therapy: Secondary | ICD-10-CM | POA: Insufficient documentation

## 2016-02-19 DIAGNOSIS — Z7982 Long term (current) use of aspirin: Secondary | ICD-10-CM | POA: Diagnosis not present

## 2016-02-19 DIAGNOSIS — I350 Nonrheumatic aortic (valve) stenosis: Secondary | ICD-10-CM | POA: Diagnosis not present

## 2016-02-19 DIAGNOSIS — Z951 Presence of aortocoronary bypass graft: Secondary | ICD-10-CM | POA: Diagnosis not present

## 2016-02-19 DIAGNOSIS — I2510 Atherosclerotic heart disease of native coronary artery without angina pectoris: Secondary | ICD-10-CM | POA: Insufficient documentation

## 2016-02-19 DIAGNOSIS — C661 Malignant neoplasm of right ureter: Secondary | ICD-10-CM | POA: Insufficient documentation

## 2016-02-19 DIAGNOSIS — I129 Hypertensive chronic kidney disease with stage 1 through stage 4 chronic kidney disease, or unspecified chronic kidney disease: Secondary | ICD-10-CM | POA: Diagnosis not present

## 2016-02-19 HISTORY — PX: CYSTOSCOPY WITH STENT PLACEMENT: SHX5790

## 2016-02-19 HISTORY — PX: CYSTOSCOPY W/ RETROGRADES: SHX1426

## 2016-02-19 LAB — TYPE AND SCREEN
ABO/RH(D): O NEG
ANTIBODY SCREEN: NEGATIVE

## 2016-02-19 SURGERY — CYSTOSCOPY, WITH RETROGRADE PYELOGRAM
Anesthesia: General | Laterality: Right

## 2016-02-19 MED ORDER — STERILE WATER FOR IRRIGATION IR SOLN
Status: DC | PRN
  Administered 2016-02-19: 3000 mL

## 2016-02-19 MED ORDER — EPHEDRINE SULFATE 50 MG/ML IJ SOLN
INTRAMUSCULAR | Status: DC | PRN
  Administered 2016-02-19 (×5): 5 mg via INTRAVENOUS

## 2016-02-19 MED ORDER — FENTANYL CITRATE (PF) 100 MCG/2ML IJ SOLN
INTRAMUSCULAR | Status: DC | PRN
  Administered 2016-02-19: 50 ug via INTRAVENOUS
  Administered 2016-02-19 (×2): 25 ug via INTRAVENOUS

## 2016-02-19 MED ORDER — FENTANYL CITRATE (PF) 100 MCG/2ML IJ SOLN
INTRAMUSCULAR | Status: AC
  Filled 2016-02-19: qty 2

## 2016-02-19 MED ORDER — SODIUM CHLORIDE 0.9 % IR SOLN
Status: DC | PRN
  Administered 2016-02-19 (×2): 3000 mL

## 2016-02-19 MED ORDER — CEFAZOLIN SODIUM-DEXTROSE 2-4 GM/100ML-% IV SOLN
INTRAVENOUS | Status: AC
  Filled 2016-02-19: qty 100

## 2016-02-19 MED ORDER — ONDANSETRON HCL 4 MG/2ML IJ SOLN
INTRAMUSCULAR | Status: AC
  Filled 2016-02-19: qty 2

## 2016-02-19 MED ORDER — OXYCODONE HCL 5 MG/5ML PO SOLN
5.0000 mg | Freq: Once | ORAL | Status: DC | PRN
  Filled 2016-02-19: qty 5

## 2016-02-19 MED ORDER — ONDANSETRON HCL 4 MG/2ML IJ SOLN
INTRAMUSCULAR | Status: DC | PRN
  Administered 2016-02-19: 4 mg via INTRAVENOUS

## 2016-02-19 MED ORDER — LIDOCAINE HCL (PF) 1 % IJ SOLN
INTRAMUSCULAR | Status: DC | PRN
  Administered 2016-02-19: 40 mg

## 2016-02-19 MED ORDER — DEXTROSE 5 % IV SOLN
2.0000 g | INTRAVENOUS | Status: AC
  Administered 2016-02-19: 2 g via INTRAVENOUS
  Filled 2016-02-19: qty 2

## 2016-02-19 MED ORDER — ARTIFICIAL TEARS OP OINT
TOPICAL_OINTMENT | OPHTHALMIC | Status: AC
  Filled 2016-02-19: qty 3.5

## 2016-02-19 MED ORDER — TRAMADOL HCL 50 MG PO TABS
50.0000 mg | ORAL_TABLET | Freq: Four times a day (QID) | ORAL | 0 refills | Status: AC | PRN
Start: 2016-02-19 — End: ?

## 2016-02-19 MED ORDER — OXYCODONE HCL 5 MG PO TABS: 5.0000 mg | ORAL_TABLET | Freq: Once | ORAL | Status: DC | PRN

## 2016-02-19 MED ORDER — DIATRIZOATE MEGLUMINE 18 % UR SOLN
URETHRAL | Status: DC | PRN
  Administered 2016-02-19: 100 mL

## 2016-02-19 MED ORDER — PROPOFOL 10 MG/ML IV BOLUS
INTRAVENOUS | Status: AC
  Filled 2016-02-19: qty 20

## 2016-02-19 MED ORDER — LIDOCAINE HCL (PF) 1 % IJ SOLN
INTRAMUSCULAR | Status: AC
  Filled 2016-02-19: qty 5

## 2016-02-19 MED ORDER — PROPOFOL 10 MG/ML IV BOLUS
INTRAVENOUS | Status: DC | PRN
  Administered 2016-02-19: 100 mg via INTRAVENOUS

## 2016-02-19 MED ORDER — STERILE WATER FOR IRRIGATION IR SOLN
Status: DC | PRN
  Administered 2016-02-19: 1000 mL

## 2016-02-19 MED ORDER — FENTANYL CITRATE (PF) 100 MCG/2ML IJ SOLN: 25.0000 ug | INTRAMUSCULAR | Status: DC | PRN

## 2016-02-19 MED ORDER — DIATRIZOATE MEGLUMINE 30 % UR SOLN
URETHRAL | Status: AC
  Filled 2016-02-19: qty 300

## 2016-02-19 MED ORDER — LACTATED RINGERS IV SOLN
INTRAVENOUS | Status: DC
  Administered 2016-02-19: 1000 mL via INTRAVENOUS
  Administered 2016-02-19: 14:00:00 via INTRAVENOUS

## 2016-02-19 MED ORDER — ONDANSETRON HCL 4 MG/2ML IJ SOLN: 4.0000 mg | Freq: Four times a day (QID) | INTRAMUSCULAR | Status: DC | PRN

## 2016-02-19 SURGICAL SUPPLY — 38 items
BAG DRAIN URO TABLE W/ADPT NS (DRAPE) ×3 IMPLANT
BAG DRN 8 ADPR NS SKTRN CSTL (DRAPE) ×1
BAG HAMPER (MISCELLANEOUS) ×3 IMPLANT
BAG URINE DRAINAGE (UROLOGICAL SUPPLIES) ×2 IMPLANT
BASKET ZERO TIP NITINOL 2.4FR (BASKET) IMPLANT
BSKT STON RTRVL ZERO TP 2.4FR (BASKET)
CATH FOLEY 3WAY 30CC 22F (CATHETERS) ×2 IMPLANT
CATH INTERMIT  6FR 70CM (CATHETERS) ×2 IMPLANT
CLOTH BEACON ORANGE TIMEOUT ST (SAFETY) ×3 IMPLANT
ELECT LOOP 22F BIPOLAR SML (ELECTROSURGICAL) ×3
ELECTRODE LOOP 22F BIPOLAR SML (ELECTROSURGICAL) IMPLANT
GLOVE BIO SURGEON STRL SZ8 (GLOVE) ×2 IMPLANT
GLOVE BIOGEL PI IND STRL 6.5 (GLOVE) IMPLANT
GLOVE BIOGEL PI IND STRL 7.0 (GLOVE) IMPLANT
GLOVE BIOGEL PI INDICATOR 6.5 (GLOVE) ×2
GLOVE BIOGEL PI INDICATOR 7.0 (GLOVE) ×2
GOWN STRL REUS W/ TWL LRG LVL3 (GOWN DISPOSABLE) ×2 IMPLANT
GOWN STRL REUS W/ TWL XL LVL3 (GOWN DISPOSABLE) ×1 IMPLANT
GOWN STRL REUS W/TWL LRG LVL3 (GOWN DISPOSABLE) ×3
GOWN STRL REUS W/TWL XL LVL3 (GOWN DISPOSABLE) ×3
GUIDEWIRE ANG ZIPWIRE 038X150 (WIRE) ×3 IMPLANT
GUIDEWIRE STR DUAL SENSOR (WIRE) IMPLANT
GUIDEWIRE STR ZIPWIRE 035X150 (MISCELLANEOUS) ×2 IMPLANT
IV NS IRRIG 3000ML ARTHROMATIC (IV SOLUTION) ×5 IMPLANT
KIT ROOM TURNOVER AP CYSTO (KITS) ×3 IMPLANT
MANIFOLD NEPTUNE II (INSTRUMENTS) ×2 IMPLANT
NDL HYPO 18GX1.5 BLUNT FILL (NEEDLE) IMPLANT
NDL SAFETY ECLIPSE 18X1.5 (NEEDLE) IMPLANT
NEEDLE HYPO 18GX1.5 BLUNT FILL (NEEDLE) ×3 IMPLANT
NEEDLE HYPO 18GX1.5 SHARP (NEEDLE) ×3
PACK CYSTO (CUSTOM PROCEDURE TRAY) ×3 IMPLANT
PAD ARMBOARD 7.5X6 YLW CONV (MISCELLANEOUS) ×3 IMPLANT
PAD TELFA 3X4 1S STER (GAUZE/BANDAGES/DRESSINGS) ×2 IMPLANT
PLUG CATH AND CAP STER (CATHETERS) ×2 IMPLANT
STENT URET 6FRX26 CONTOUR (STENTS) ×2 IMPLANT
SYR BULB IRRIGATION 50ML (SYRINGE) ×2 IMPLANT
TUBE FEEDING 8FR (TUBING) ×2 IMPLANT
WATER STERILE IRR 3000ML UROMA (IV SOLUTION) ×2 IMPLANT

## 2016-02-19 NOTE — Anesthesia Postprocedure Evaluation (Signed)
Anesthesia Post Note Late Entry for 1530  Patient: Brian [name]  Procedure(s) Performed: Procedure(s) (LRB): CYSTOSCOPY WITH RIGHT RETROGRADE PYELOGRAM (Right) CYSTOSCOPY WITH RIGHT URETERA; STENT EXCHANGE (Right) CYSTOSCOPY WITH RIGHT URETERAL RESECTION (Right)  Patient location during evaluation: PACU Anesthesia Type: General Level of consciousness: awake and oriented Pain management: pain level controlled Vital Signs Assessment: post-procedure vital signs reviewed and stable Respiratory status: spontaneous breathing Cardiovascular status: stable Postop Assessment: no signs of nausea or vomiting Anesthetic complications: no    Last Vitals:  Vitals:   02/19/16 1545 02/19/16 1556  BP: 131/60 (!) 146/61  Pulse: 71 65  Resp: 13 16  Temp:  36.6 C    Last Pain:  Vitals:   02/19/16 1556  TempSrc: Oral                 Kira Hartl A

## 2016-02-19 NOTE — Anesthesia Procedure Notes (Signed)
Procedure Name: LMA Insertion Date/Time: 02/19/2016 1:28 PM Performed by: Raenette Rover Pre-anesthesia Checklist: Patient identified, Emergency Drugs available, Suction available and Patient being monitored Patient Re-evaluated:Patient Re-evaluated prior to inductionOxygen Delivery Method: Circle system utilized Preoxygenation: Pre-oxygenation with 100% oxygen Intubation Type: IV induction Ventilation: Mask ventilation without difficulty LMA: LMA inserted LMA Size: 5.0 Number of attempts: 1 Placement Confirmation: positive ETCO2,  CO2 detector and breath sounds checked- equal and bilateral Tube secured with: Tape Dental Injury: Teeth and Oropharynx as per pre-operative assessment

## 2016-02-19 NOTE — Discharge Instructions (Signed)
PATIENT INSTRUCTIONS POST-ANESTHESIA  IMMEDIATELY FOLLOWING SURGERY:  Do not drive or operate machinery for the first twenty four hours after surgery.  Do not make any important decisions for twenty four hours after surgery or while taking narcotic pain medications or sedatives.  If you develop intractable nausea and vomiting or a severe headache please notify your doctor immediately.  FOLLOW-UP:  Please make an appointment with your surgeon as instructed. You do not need to follow up with anesthesia unless specifically instructed to do so.  WOUND CARE INSTRUCTIONS (if applicable):  Keep a dry clean dressing on the anesthesia/puncture wound site if there is drainage.  Once the wound has quit draining you may leave it open to air.  Generally you should leave the bandage intact for twenty four hours unless there is drainage.  If the epidural site drains for more than 36-48 hours please call the anesthesia department.  QUESTIONS?:  Please feel free to call your physician or the hospital operator if you have any questions, and they will be happy to assist you.        Foley Catheter Care, Adult A Foley catheter is a soft, flexible tube. This tube is placed into your bladder to drain pee (urine). If you go home with this catheter in place, follow the instructions below. TAKING CARE OF THE CATHETER 1. Wash your hands with soap and water. 2. Put soap and water on a clean washcloth.  Clean the skin where the tube goes into your body.  Clean away from the tube site.  Never wipe toward the tube.  Clean the area using a circular motion.  Remove all the soap. Pat the area dry with a clean towel. For males, reposition the skin that covers the end of the penis (foreskin). 3. Attach the tube to your leg with tape or a leg strap. Do not stretch the tube tight. If you are using tape, remove any stickiness left behind by past tape you used. 4. Keep the drainage bag below your hips. Keep it off the  floor. 5. Check your tube during the day. Make sure it is working and draining. Make sure the tube does not curl, twist, or bend. 6. Do not pull on the tube or try to take it out. TAKING CARE OF THE DRAINAGE BAGS You will have a large overnight drainage bag and a small leg bag. You may wear the overnight bag any time. Never wear the small bag at night. Follow the directions below. Emptying the Drainage Bag Empty your drainage bag when it is  - full or at least 2-3 times a day. 1. Wash your hands with soap and water. 2. Keep the drainage bag below your hips. 3. Hold the dirty bag over the toilet or clean container. 4. Open the pour spout at the bottom of the bag. Empty the pee into the toilet or container. Do not let the pour spout touch anything. 5. Clean the pour spout with a gauze pad or cotton ball that has rubbing alcohol on it. 6. Close the pour spout. 7. Attach the bag to your leg with tape or a leg strap. 8. Wash your hands well. Changing the Drainage Bag Change your bag once a month or sooner if it starts to smell or look dirty.  1. Wash your hands with soap and water. 2. Pinch the rubber tube so that pee does not spill out. 3. Disconnect the catheter tube from the drainage tube at the connection valve. Do not let  the tubes touch anything. 4. Clean the end of the catheter tube with an alcohol wipe. Clean the end of a the drainage tube with a different alcohol wipe. 5. Connect the catheter tube to the drainage tube of the clean drainage bag. 6. Attach the new bag to the leg with tape or a leg strap. Avoid attaching the new bag too tightly. 7. Wash your hands well. Cleaning the Drainage Bag 1. Wash your hands with soap and water. 2. Wash the bag in warm, soapy water. 3. Rinse the bag with warm water. 4. Fill the bag with a mixture of white vinegar and water (1 cup vinegar to 1 quart warm water [.2 liter vinegar to 1 liter warm water]). Close the bag and soak it for 30 minutes in  the solution. 5. Rinse the bag with warm water. 6. Hang the bag to dry with the pour spout open and hanging downward. 7. Store the clean bag (once it is dry) in a clean plastic bag. 8. Wash your hands well. PREVENT INFECTION  Wash your hands before and after touching your tube.  Take showers every day. Wash the skin where the tube enters your body. Do not take baths. Replace wet leg straps with dry ones, if this applies.  Do not use powders, sprays, or lotions on the genital area. Only use creams, lotions, or ointments as told by your doctor.  For females, wipe from front to back after going to the bathroom.  Drink enough fluids to keep your pee clear or pale yellow unless you are told not to have too much fluid (fluid restriction).  Do not let the drainage bag or tubing touch or lie on the floor.  Wear cotton underwear to keep the area dry. GET HELP IF:  Your pee is cloudy or smells unusually bad.  Your tube becomes clogged.  You are not draining pee into the bag or your bladder feels full.  Your tube starts to leak. GET HELP RIGHT AWAY IF:  You have pain, puffiness (swelling), redness, or yellowish-white fluid (pus) where the tube enters the body.  You have pain in the belly (abdomen), legs, lower back, or bladder.  You have a fever.  You see blood fill the tube, or your pee is pink or red.  You feel sick to your stomach (nauseous), throw up (vomit), or have chills.  Your tube gets pulled out. MAKE SURE YOU:   Understand these instructions.  Will watch your condition.  Will get help right away if you are not doing well or get worse.   This information is not intended to replace advice given to you by your health care provider. Make sure you discuss any questions you have with your health care provider.   Document Released: 08/15/2012 Document Revised: 05/11/2014 Document Reviewed: 08/15/2012 Elsevier Interactive Patient Education 2016 Elsevier  Inc.    Ureteral Stent Implantation, Care After Refer to this sheet in the next few weeks. These instructions provide you with information on caring for yourself after your procedure. Your health care provider may also give you more specific instructions. Your treatment has been planned according to current medical practices, but problems sometimes occur. Call your health care provider if you have any problems or questions after your procedure. WHAT TO EXPECT AFTER THE PROCEDURE You should be back to normal activity within 48 hours after the procedure. Nausea and vomiting may occur and are commonly the result of anesthesia. It is common to experience sharp pain in the  back or lower abdomen and penis with voiding. This is caused by movement of the ends of the stent with the act of urinating.It usually goes away within minutes after you have stopped urinating. HOME CARE INSTRUCTIONS Make sure to drink plenty of fluids. You may have small amounts of bleeding, causing your urine to be red. This is normal. Certain movements may trigger pain or a feeling that you need to urinate. You may be given medicines to prevent infection or bladder spasms. Be sure to take all medicines as directed. Only take over-the-counter or prescription medicines for pain, discomfort, or fever as directed by your health care provider. Do not take aspirin, as this can make bleeding worse. Your stent will be left in until the blockage is resolved. This may take 2 weeks or longer, depending on the reason for stent implantation. You may have an X-ray exam to make sure your ureter is open and that the stent has not moved out of position (migrated). The stent can be removed by your health care provider in the office. Medicines may be given for comfort while the stent is being removed. Be sure to keep all follow-up appointments so your health care provider can check that you are healing properly. SEEK MEDICAL CARE IF:  You experience  increasing pain.  Your pain medicine is not working. SEEK IMMEDIATE MEDICAL CARE IF:  Your urine is dark red or has blood clots.  You are leaking urine (incontinent).  You have a fever, chills, feeling sick to your stomach (nausea), or vomiting.  Your pain is not relieved by pain medicine.  The end of the stent comes out of the urethra.  You are unable to urinate.   This information is not intended to replace advice given to you by your health care provider. Make sure you discuss any questions you have with your health care provider.   Document Released: 12/21/2012 Document Revised: 04/25/2013 Document Reviewed: 11/02/2014 Elsevier Interactive Patient Education [employer].

## 2016-02-19 NOTE — H&P (Signed)
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 He is here for resection  Past Medical History:  Diagnosis Date  . Aortic stenosis 3/11   Very mild, possibly bicuspid valve  . Coronary atherosclerosis of native coronary artery 5/11   Multivessel, DES circumflex 5/11, 50% LAD, occluded RCA, LVEF 60%  . Essential hypertension, benign   . GERD (gastroesophageal reflux disease)    Past Surgical History:  Procedure Laterality Date  . BIOPSY N/A 01/15/2016   Procedure: BLADDER BIOPSY WITH FULGERATION;  Surgeon: Cleon Gustin, MD;  Location: [facility];  Service: Urology;  Laterality: N/A;  . CATARACT EXTRACTION     BILATERAL  . CORONARY ANGIOPLASTY     stent   4 years ago  . CORONARY ARTERY BYPASS GRAFT    . CYSTOSCOPY W/ RETROGRADES Right 01/15/2016   Procedure: CYSTOSCOPY WITH RIGHT RETROGRADE PYELOGRAM;  Surgeon: Cleon Gustin, MD;  Location: [facility];  Service: Urology;  Laterality: Right;  . CYSTOSCOPY WITH STENT PLACEMENT Right 01/15/2016   Procedure: CYSTOSCOPY  WITH RIGHT STENT PLACEMENT;  Surgeon: Cleon Gustin, MD;  Location: [facility];  Service: Urology;  Laterality: Right;  . URETEROSCOPY Right 01/15/2016   Procedure: RIGHT URETEROSCOPY WITH BIOPSY;  Surgeon: Cleon Gustin, MD;  Location: [facility];  Service: Urology;  Laterality: Right;    Home Medications:  Prescriptions Prior to Admission  Medication Sig Dispense Refill Last Dose  . amLODipine (NORVASC) 10 MG tablet Take 10 mg by mouth daily.   02/19/2016 at 0600  . aspirin 81 MG tablet Take 81 mg by mouth daily.     02/13/2016 at 2000  . atorvastatin (LIPITOR) 40 MG tablet Take 1 tablet (40 mg total) by mouth daily. 90 tablet 3 02/18/2016 at 2000  . famotidine (PEPCID) 20 MG tablet Take 20 mg by mouth daily as needed for heartburn or indigestion.    02/19/2016 at 0600  .  traMADol (ULTRAM) 50 MG tablet Take 1 tablet (50 mg total) by mouth every 6 (six) hours as needed. (Patient taking differently: Take 50 mg by mouth every 6 (six) hours as needed for moderate pain. ) 30 tablet 0 Past Month at Unknown time  . valsartan (DIOVAN) 320 MG tablet Take 320 mg by mouth daily.   02/19/2016 at 0600  . nitroGLYCERIN (NITROSTAT) 0.4 MG SL tablet Place 0.4 mg under the tongue every 5 (five) minutes as needed. TAKE AS DIRECTED FOR CHEST PAIN, NOT TO EXCEED 3 DOSES.    Unknown at Unknown time   Allergies:  Allergies  Allergen Reactions  . Codeine     REACTION: nausea    History reviewed. No pertinent family history. Social History:  reports that he quit smoking about 27 years ago. His smoking use included Cigarettes. He has a 50.00 pack-year smoking history. He has never used smokeless tobacco. He reports that he does not drink alcohol or use drugs.  Review of Systems  Genitourinary: Positive for hematuria.  All other systems reviewed and are negative.   Physical Exam:  Vital signs in last 24 hours: Temp:  [97.7 F (36.5 C)] 97.7 F (36.5 C) (10/18 1110) Pulse Rate:  [61] 61 (10/18 1110) Resp:  [12-23] 14 (10/18 1145) BP: (148-151)/(50-58) 151/50 (10/18 1145) SpO2:  [98 %-100 %] 100 % (10/18 1145) Weight:  [73 kg (161 lb)] 73 kg (161 lb) (  10/18 1110) Physical Exam  Constitutional: He is oriented to person, place, and time. He appears well-developed and well-nourished.  HENT:  Head: Normocephalic and atraumatic.  Eyes: Conjunctivae are normal. Pupils are equal, round, and reactive to light.  Neck: Normal range of motion. No thyromegaly present.  Cardiovascular: Normal rate and regular rhythm.   Respiratory: Effort normal. No respiratory distress.  GI: Soft. He exhibits no distension. There is no tenderness.  Musculoskeletal: Normal range of motion. He exhibits no edema.  Neurological: He is alert and oriented to person, place, and time.  Skin: Skin is warm and  dry.  Psychiatric: He has a normal mood and affect. His behavior is normal. Judgment and thought content normal.    Laboratory Data:  No results found for this or any previous visit (from the past 24 hour(s)). No results found for this or any previous visit (from the past 240 hour(s)). Creatinine:  Recent Labs  02/14/16 1007  CREATININE 2.17*   Baseline Creatinine: 2  Impression/Assessment:  82yo with high grade ureteral TCC  Plan:  The risks/benefits/alternatives to right ureteral resection was explained to the patient and he understands and wishes to proceed with surgery  Nicolette Bang 02/19/2016, 11:59 AM

## 2016-02-19 NOTE — Transfer of Care (Signed)
Immediate Anesthesia Transfer of Care Note  Patient: Brian [name]  Procedure(s) Performed: Procedure(s): CYSTOSCOPY WITH RIGHT RETROGRADE PYELOGRAM (Right) CYSTOSCOPY WITH RIGHT URETERA; STENT EXCHANGE (Right) CYSTOSCOPY WITH RIGHT URETERAL RESECTION (Right)  Patient Location: PACU  Anesthesia Type:General  Level of Consciousness: awake, alert , oriented and patient cooperative  Airway & Oxygen Therapy: Patient Spontanous Breathing and Patient connected to nasal cannula oxygen  Post-op Assessment: Report given to RN and Post -op Vital signs reviewed and stable  Post vital signs: Reviewed and stable  Last Vitals:  Vitals:   02/19/16 1245 02/19/16 1315  BP: (!) 134/56 (!) 143/66  Pulse:    Resp: 11 (!) 25  Temp:      Last Pain:  Vitals:   02/19/16 1110  TempSrc: Oral      Patients Stated Pain Goal: 8 ([id number])  Complications: No apparent anesthesia complications

## 2016-02-19 NOTE — Anesthesia Preprocedure Evaluation (Signed)
Anesthesia Evaluation  Patient identified by MRN, date of birth, ID band Patient awake    Reviewed: Allergy & Precautions, H&P , NPO status , Patient's Chart, lab work & pertinent test results  Airway Mallampati: II   Neck ROM: full    Dental   Pulmonary former smoker,    breath sounds clear to auscultation       Cardiovascular hypertension, + CAD, + Cardiac Stents and + CABG   Rhythm:regular Rate:Normal     Neuro/Psych    GI/Hepatic GERD  ,  Endo/Other    Renal/GU Renal InsufficiencyRenal disease     Musculoskeletal   Abdominal   Peds  Hematology  (+) anemia ,   Anesthesia Other Findings   Reproductive/Obstetrics                             Anesthesia Physical Anesthesia Plan  ASA: III  Anesthesia Plan: General   Post-op Pain Management:    Induction: Intravenous  Airway Management Planned: LMA  Additional Equipment:   Intra-op Plan:   Post-operative Plan:   Informed Consent: I have reviewed the patients History and Physical, chart, labs and discussed the procedure including the risks, benefits and alternatives for the proposed anesthesia with the patient or authorized representative who has indicated his/her understanding and acceptance.     Plan Discussed with: CRNA, Anesthesiologist and Surgeon  Anesthesia Plan Comments:         Anesthesia Quick Evaluation

## 2016-02-19 NOTE — Brief Op Note (Signed)
02/19/2016  3:10 PM  PATIENT:  Brian [name]  82 y.o. male  PRE-OPERATIVE DIAGNOSIS:  right ureteral cancer  POST-OPERATIVE DIAGNOSIS:  right ureteral cancer  PROCEDURE:  Procedure(s): CYSTOSCOPY WITH RIGHT RETROGRADE PYELOGRAM (Right) CYSTOSCOPY WITH RIGHT URETERA; STENT EXCHANGE (Right) CYSTOSCOPY WITH RIGHT URETERAL RESECTION (Right)  SURGEON:  Surgeon(s) and Role:    * Cleon Gustin, MD - Primary  PHYSICIAN ASSISTANT:   ASSISTANTS: none   ANESTHESIA:   general  EBL:  Total I/O In: 1000 [I.V.:1000] Out: -   BLOOD ADMINISTERED:none  DRAINS: right 6x26 JJ ureteral stetn without tether   LOCAL MEDICATIONS USED:  NONE  SPECIMEN:  Source of Specimen:  right ureteral tumor  DISPOSITION OF SPECIMEN:  PATHOLOGY  COUNTS:  YES  TOURNIQUET:  * No tourniquets in log *  DICTATION: .Note written in EPIC  PLAN OF CARE: Discharge to home after PACU  PATIENT DISPOSITION:  PACU - hemodynamically stable.   Delay start of Pharmacological VTE agent (>24hrs) due to surgical blood loss or risk of bleeding: not applicable

## 2016-02-24 ENCOUNTER — Encounter (HOSPITAL_COMMUNITY): Payer: Self-pay | Admitting: Urology

## 2016-02-25 NOTE — Op Note (Signed)
Preoperative diagnosis: right ureteral tumor  Postop diagnosis: Same  Procedure: 1.  Cystoscopy 2. right retrograde pyelography 3. Intra-operative fluoroscopy, under 1 hour, with interpretation 4.Transurethral resection of a ureteral tumor 5. Right 6x26 JJ Stent exchange  Attending: Nicolette Bang  Anesthesia: General  Estimated blood loss: 5 cc  Drains: 1. 22 French Foley catheter 2. Right 6x26 JJ ureteral Stent  Specimens: right ureteral tumor  Antibiotics: Rocephin  Findings: right distal ureteral tumor. Severe right hydroureteronephrosis  Indications: Patient is a 82 year old with a history of high grade ureteral cancer and is not a candidate for nephroureterectomy.  After discussing treatment options patient decided to proceed with transurethral resection of the ureteral tumor  Procedure in detail: Prior to procedure consetn was obtained. Patient was brought to the operating room and briefing was done sure correct patient, correct procedure, correct site.  General anesthesia was in administered patient was placed in the dorsal lithotomy position.  The rigid 52 French cystoscope was passed urethra and bladder.  Bladder was inspected masses or lesions and none were seen. We then used a grasper the bring the stent to the urethral meatus. A zipwire was then advanced through the stent and the stent was then removed. We then cannulated the right ureteral orifice with a 6 French ureteral catheter.  A gentle retrograde was obtained in findings noted above.  We then cannulated the right ureter with a ureteral resectoscope. We proceeded to resect the tumor down to the base and then obtained hemostasis with electrocautery. We then removed the cystoscope and placed a 27 French resectoscope in the bladder.  Using bipolar electrocautery were then removed the right ureteral orifice to remove the distal aspect of the tumor. We then removed the pieces and sent them for pathology.  To obtain  hemostasis we then cauterized the bed of the tumors.  Once good hemostasis was noted  We then placed a 6 x 26 double-J ureteral stent over the zipe wire.  We then removed the wire and good coil was noted in the pelvis under fluoroscopy in the bladder under direct vision.  the bladder was then drained and a 18 French Foley catheter was placed. This concluded the procedure which resulted by the patient.  Complications: None  Condition: Stable,  extubated, transferred to PACU.  Plan: Pt is to be discharged home. He is to followup in 1 week for a voiding trial

## 2016-02-26 ENCOUNTER — Ambulatory Visit (INDEPENDENT_AMBULATORY_CARE_PROVIDER_SITE_OTHER): Payer: Medicare Other | Admitting: Urology

## 2016-02-26 ENCOUNTER — Ambulatory Visit (HOSPITAL_COMMUNITY): Payer: Medicare Other | Admitting: Oncology

## 2016-02-26 DIAGNOSIS — C669 Malignant neoplasm of unspecified ureter: Secondary | ICD-10-CM | POA: Diagnosis not present

## 2016-03-04 ENCOUNTER — Ambulatory Visit (INDEPENDENT_AMBULATORY_CARE_PROVIDER_SITE_OTHER): Payer: Medicare Other | Admitting: Urology

## 2016-03-04 DIAGNOSIS — C669 Malignant neoplasm of unspecified ureter: Secondary | ICD-10-CM | POA: Diagnosis not present

## 2016-03-18 ENCOUNTER — Ambulatory Visit (INDEPENDENT_AMBULATORY_CARE_PROVIDER_SITE_OTHER): Payer: Medicare Other | Admitting: Urology

## 2016-03-18 DIAGNOSIS — C669 Malignant neoplasm of unspecified ureter: Secondary | ICD-10-CM | POA: Diagnosis not present

## 2016-03-23 ENCOUNTER — Encounter (HOSPITAL_COMMUNITY): Payer: Self-pay | Admitting: Oncology

## 2016-03-23 ENCOUNTER — Encounter (HOSPITAL_COMMUNITY): Payer: Medicare Other

## 2016-03-23 ENCOUNTER — Encounter (HOSPITAL_COMMUNITY): Payer: Medicare Other | Attending: Oncology | Admitting: Oncology

## 2016-03-23 ENCOUNTER — Ambulatory Visit (HOSPITAL_COMMUNITY)
Admission: RE | Admit: 2016-03-23 | Discharge: 2016-03-23 | Disposition: A | Discharge status: Home / Self Care | Payer: Medicare Other | Source: Ambulatory Visit | Attending: Oncology | Admitting: Oncology

## 2016-03-23 VITALS — BP 127/44 | HR 70 | Temp 98.1°F | Resp 16 | Ht 70.5 in | Wt 157.0 lb

## 2016-03-23 DIAGNOSIS — D649 Anemia, unspecified: Secondary | ICD-10-CM | POA: Diagnosis not present

## 2016-03-23 DIAGNOSIS — R809 Proteinuria, unspecified: Secondary | ICD-10-CM | POA: Insufficient documentation

## 2016-03-23 DIAGNOSIS — M5134 Other intervertebral disc degeneration, thoracic region: Secondary | ICD-10-CM | POA: Insufficient documentation

## 2016-03-23 DIAGNOSIS — I700 Atherosclerosis of aorta: Secondary | ICD-10-CM | POA: Insufficient documentation

## 2016-03-23 DIAGNOSIS — I6529 Occlusion and stenosis of unspecified carotid artery: Secondary | ICD-10-CM | POA: Insufficient documentation

## 2016-03-23 DIAGNOSIS — M5136 Other intervertebral disc degeneration, lumbar region: Secondary | ICD-10-CM | POA: Diagnosis not present

## 2016-03-23 DIAGNOSIS — M5030 Other cervical disc degeneration, unspecified cervical region: Secondary | ICD-10-CM | POA: Insufficient documentation

## 2016-03-23 DIAGNOSIS — D696 Thrombocytopenia, unspecified: Secondary | ICD-10-CM

## 2016-03-23 DIAGNOSIS — N184 Chronic kidney disease, stage 4 (severe): Secondary | ICD-10-CM

## 2016-03-23 DIAGNOSIS — I70203 Unspecified atherosclerosis of native arteries of extremities, bilateral legs: Secondary | ICD-10-CM | POA: Diagnosis not present

## 2016-03-23 DIAGNOSIS — R808 Other proteinuria: Secondary | ICD-10-CM

## 2016-03-23 HISTORY — DX: Proteinuria, unspecified: R80.9

## 2016-03-23 HISTORY — DX: Chronic kidney disease, stage 4 (severe): N18.4

## 2016-03-23 LAB — CBC WITH DIFFERENTIAL/PLATELET
Basophils Absolute: 0 10*3/uL (ref 0.0–0.1)
Basophils Relative: 1 %
Eosinophils Absolute: 0.1 10*3/uL (ref 0.0–0.7)
Eosinophils Relative: 3 %
HEMATOCRIT: 25.4 % — AB (ref 39.0–52.0)
HEMOGLOBIN: 8.5 g/dL — AB (ref 13.0–17.0)
LYMPHS ABS: 1.5 10*3/uL (ref 0.7–4.0)
Lymphocytes Relative: 29 %
MCH: 34.1 pg — AB (ref 26.0–34.0)
MCHC: 33.5 g/dL (ref 30.0–36.0)
MCV: 102 fL — AB (ref 78.0–100.0)
MONO ABS: 0.5 10*3/uL (ref 0.1–1.0)
MONOS PCT: 9 %
NEUTROS ABS: 3 10*3/uL (ref 1.7–7.7)
NEUTROS PCT: 58 %
Platelets: 115 10*3/uL — ABNORMAL LOW (ref 150–400)
RBC: 2.49 MIL/uL — ABNORMAL LOW (ref 4.22–5.81)
RDW: 13.5 % (ref 11.5–15.5)
WBC: 5.2 10*3/uL (ref 4.0–10.5)

## 2016-03-23 LAB — COMPREHENSIVE METABOLIC PANEL
ALK PHOS: 90 U/L (ref 38–126)
ALT: 11 U/L — ABNORMAL LOW (ref 17–63)
ANION GAP: 4 — AB (ref 5–15)
AST: 21 U/L (ref 15–41)
Albumin: 3.9 g/dL (ref 3.5–5.0)
BILIRUBIN TOTAL: 0.4 mg/dL (ref 0.3–1.2)
BUN: 42 mg/dL — ABNORMAL HIGH (ref 6–20)
CALCIUM: 8.6 mg/dL — AB (ref 8.9–10.3)
CO2: 23 mmol/L (ref 22–32)
Chloride: 110 mmol/L (ref 101–111)
Creatinine, Ser: 2.19 mg/dL — ABNORMAL HIGH (ref 0.61–1.24)
GFR, EST AFRICAN AMERICAN: 30 mL/min — AB (ref >60)
GFR, EST NON AFRICAN AMERICAN: 26 mL/min — AB (ref >60)
GLUCOSE: 100 mg/dL — AB (ref 65–99)
POTASSIUM: 4.5 mmol/L (ref 3.5–5.1)
Sodium: 137 mmol/L (ref 135–145)
TOTAL PROTEIN: 7.4 g/dL (ref 6.5–8.1)

## 2016-03-23 LAB — IRON AND TIBC
IRON: 54 ug/dL (ref 45–182)
Saturation Ratios: 20 % (ref 17.9–39.5)
TIBC: 265 ug/dL (ref 250–450)
UIBC: 211 ug/dL

## 2016-03-23 LAB — RETICULOCYTES
RBC.: 2.49 MIL/uL — AB (ref 4.22–5.81)
RETIC CT PCT: 1.4 % (ref 0.4–3.1)
Retic Count, Absolute: 34.9 10*3/uL (ref 19.0–186.0)

## 2016-03-23 LAB — VITAMIN B12: Vitamin B-12: 296 pg/mL (ref 180–914)

## 2016-03-23 LAB — SEDIMENTATION RATE: SED RATE: 50 mm/h — AB (ref 0–16)

## 2016-03-23 LAB — LACTATE DEHYDROGENASE: LDH: 165 U/L (ref 98–192)

## 2016-03-23 LAB — FERRITIN: FERRITIN: 48 ng/mL (ref 24–336)

## 2016-03-23 NOTE — Patient Instructions (Addendum)
[place] at [facility] Discharge Instructions  RECOMMENDATIONS MADE BY THE CONSULTANT AND ANY TEST RESULTS WILL BE SENT TO YOUR REFERRING PHYSICIAN.  You were seen today by Kirby Crigler PA-C. Labs today Bone survey today. Follow up in 2-3 weeks. See Amy at checkout for appointments.   Thank you for choosing [place] at [facility] to provide your oncology and hematology care.  To afford each patient quality time with our provider, please arrive at least 15 minutes before your scheduled appointment time.   Beginning January 23rd 2017 lab work for the [facility] will be done in the  Main lab at [facility] on 1st floor. If you have a lab appointment with the [facility] please come in thru the  Main Entrance and check in at the main information desk  You need to re-schedule your appointment should you arrive 10 or more minutes late.  We strive to give you quality time with our providers, and arriving late affects you and other patients whose appointments are after yours.  Also, if you no show three or more times for appointments you may be dismissed from the clinic at the providers discretion.     Again, thank you for choosing [facility].  Our hope is that these requests will decrease the amount of time that you wait before being seen by our physicians.       _____________________________________________________________  Should you have questions after your visit to [facility], please contact our office at [phone] between the hours of 8:30 a.m. and 4:30 p.m.  Voicemails left after 4:30 p.m. will not be returned until the following business day.  For prescription refill requests, have your pharmacy contact our office.         Resources For Cancer Patients and their Caregivers ? American Cancer Society: Can assist with transportation, wigs, general needs, runs Look Good Feel Better.         223-027-3133 ? Cancer Care: Provides financial assistance, online support groups, medication/co-pay assistance.  1-800-813-HOPE 407-187-7921) ? [facility] Assists [place] cancer patients and their families through emotional , educational and financial support.  (212)836-3232 ? [place] DSS Where to apply for food stamps, Medicaid and utility assistance. 352-858-9664 ? [facility]: Transportation to medical appointments. (856) 731-5025 ? Social Security Administration: May apply for disability if have a Stage IV cancer. 7872407862 2484297170 ? [employer], Disability and Transit Services: Assists with nutrition, care and transit needs. [place] Support Programs: @10RELATIVEDAYS @ > Cancer Support Group  2nd Tuesday of the month 1pm-2pm, Journey Room  > Creative Journey  3rd Tuesday of the month 1130am-1pm, Journey Room  > Look Good Feel Better  1st Wednesday of the month 10am-12 noon, Journey Room (Call [place] to register [phone])

## 2016-03-23 NOTE — Progress Notes (Signed)
[facility] Hematology/Oncology Consultation   Name: Brian [name]      MRN: [id number]   Date: 03/23/2016 Time:6:14 PM   REFERRING PHYSICIAN:  Fran Lowes, MD (Nephrology)  REASON FOR CONSULT:  "M-Spike on 24 Hour urine for protein-immunoelectrophoresis"   DIAGNOSIS:  M-spike in urine  HISTORY OF PRESENT ILLNESS:   Brian [name] is a 82 y.o. male with a medical history significant for aortic stenosis, CAD, HTN, hyperlipidemia, GERD, invasive urothelial cancer having undergone a transurethral resection of tumor on 02/19/2016 by Dr. Alyson Ingles (urology) who is referred to the [facility] for a detected M-spike in his 24 hour urine collection.  He recently underwent a transurethral resection of tumor by Dr. Alyson Ingles on 02/19/2016.  This demonstrated an infiltrative high grade papillary urothelia cancer.  Pathology illustrates an intact and uninvolved muscular propria.  He is now on BCG treatments, started on 03/18/2016.  He will receive 6 treatments.  He denies any B symptoms, abdominal pain, chest pain, confusion, change in appetite, new/acute bone pains.  He does admit to some weight loss.  He weighs 157 lbs.  He reports a ~13 lbs weight loss x 6-12 months.  He is provided education on upcoming work-up and the reason for his referral.  He is educated on proteinuria.  He knows that we are working him up for multiple myeloma.  He may have MGUS.  We will review this in more detail at his follow-up appointment.  Review of Systems  Constitutional: Positive for weight loss (12 lbs x 6-12 months). Negative for chills, fever and malaise/fatigue.  HENT: Negative.   Eyes: Negative.  Negative for double vision.  Respiratory: Negative.  Negative for cough, hemoptysis, sputum production and shortness of breath.   Cardiovascular: Negative.  Negative for chest pain and leg swelling.  Gastrointestinal: Negative.  Negative for abdominal pain, constipation,  diarrhea, nausea and vomiting.  Genitourinary: Negative.   Musculoskeletal: Negative.  Negative for myalgias.  Skin: Negative.   Neurological: Negative for focal weakness, weakness and headaches.  Endo/Heme/Allergies: Negative.   Psychiatric/Behavioral: Negative.   14 point review of systems was performed and is negative except as detailed under history of present illness and above    PAST MEDICAL HISTORY:   Past Medical History:  Diagnosis Date  . Aortic stenosis 3/11   Very mild, possibly bicuspid valve  . Chronic renal disease, stage 4, severely decreased glomerular filtration rate (GFR) between 15-29 mL/min/1.73 square meter (HCC) 03/23/2016  . Coronary atherosclerosis of native coronary artery 5/11   Multivessel, DES circumflex 5/11, 50% LAD, occluded RCA, LVEF 60%  . Essential hypertension, benign   . GERD (gastroesophageal reflux disease)   . Proteinuria 03/23/2016    ALLERGIES: Allergies  Allergen Reactions  . Codeine     REACTION: nausea      MEDICATIONS: I have reviewed the patient's current medications.    Current Outpatient Prescriptions on File Prior to Visit  Medication Sig Dispense Refill  . amLODipine (NORVASC) 10 MG tablet Take 10 mg by mouth daily.    Marland Kitchen aspirin 81 MG tablet Take 81 mg by mouth daily.      Marland Kitchen atorvastatin (LIPITOR) 40 MG tablet Take 1 tablet (40 mg total) by mouth daily. 90 tablet 3  . famotidine (PEPCID) 20 MG tablet Take 20 mg by mouth daily as needed for heartburn or indigestion.     . nitroGLYCERIN (NITROSTAT) 0.4 MG SL tablet Place 0.4 mg under the  tongue every 5 (five) minutes as needed. TAKE AS DIRECTED FOR CHEST PAIN, NOT TO EXCEED 3 DOSES.     Marland Kitchen traMADol (ULTRAM) 50 MG tablet Take 1 tablet (50 mg total) by mouth every 6 (six) hours as needed. 30 tablet 0  . valsartan (DIOVAN) 320 MG tablet Take 320 mg by mouth daily.     No current facility-administered medications on file prior to visit.      PAST SURGICAL HISTORY Past Surgical  History:  Procedure Laterality Date  . BIOPSY N/A 01/15/2016   Procedure: BLADDER BIOPSY WITH FULGERATION;  Surgeon: Cleon Gustin, MD;  Location: [facility];  Service: Urology;  Laterality: N/A;  . CATARACT EXTRACTION     BILATERAL  . CORONARY ANGIOPLASTY     stent   4 years ago  . CORONARY ARTERY BYPASS GRAFT    . CYSTOSCOPY W/ RETROGRADES Right 01/15/2016   Procedure: CYSTOSCOPY WITH RIGHT RETROGRADE PYELOGRAM;  Surgeon: Cleon Gustin, MD;  Location: [facility];  Service: Urology;  Laterality: Right;  . CYSTOSCOPY W/ RETROGRADES Right 02/19/2016   Procedure: CYSTOSCOPY WITH RIGHT RETROGRADE PYELOGRAM;  Surgeon: Cleon Gustin, MD;  Location: [facility];  Service: Urology;  Laterality: Right;  . CYSTOSCOPY WITH STENT PLACEMENT Right 01/15/2016   Procedure: CYSTOSCOPY  WITH RIGHT STENT PLACEMENT;  Surgeon: Cleon Gustin, MD;  Location: [facility];  Service: Urology;  Laterality: Right;  . CYSTOSCOPY WITH STENT PLACEMENT Right 02/19/2016   Procedure: CYSTOSCOPY WITH RIGHT URETERA; STENT EXCHANGE;  Surgeon: Cleon Gustin, MD;  Location: [facility];  Service: Urology;  Laterality: Right;  . URETEROSCOPY Right 01/15/2016   Procedure: RIGHT URETEROSCOPY WITH BIOPSY;  Surgeon: Cleon Gustin, MD;  Location: [facility];  Service: Urology;  Laterality: Right;    FAMILY HISTORY: History reviewed. No pertinent family history.  He has 5 children, 1 daughter deceased from MI at the age of 6. He has 1 son who had part or all of his kidney removed for a malignancy.  SOCIAL HISTORY:  reports that he quit smoking about 27 years ago. His smoking use included Cigarettes. He has a 80.00 pack-year smoking history. He has never used smokeless tobacco. He reports that he does not drink alcohol or use drugs.  He is divorced, but remarried x 48 years.  His wife's age is 90 and in good health. He is a Engineer, manufacturing.  He is retired from [employer].  Social History   Social History  . Marital status: Married    Spouse  name: N/A  . Number of children: N/A  . Years of education: N/A   Occupational History  . Retired Retired    [employer]  . Smoking status: Former Smoker    Packs/day: 2.00    Years: 40.00    Types: Cigarettes    Quit date: 07/01/1988  . Smokeless tobacco: Never Used  . Alcohol use No  . Drug use: No  . Sexual activity: No   Other Topics Concern  . None   Social History Narrative  . None    PERFORMANCE STATUS: The patient's performance status is 1 - Symptomatic but completely ambulatory  PHYSICAL EXAM: Most Recent Vital Signs: Blood pressure (!) 127/44, pulse 70, temperature 98.1 F (36.7 C), temperature source Oral, resp. rate 16, height 5' 10.5" (1.791 m), weight 157 lb (71.2 kg), SpO2 100 %. General appearance: alert, cooperative, appears stated age, no distress and unaccompanied Head: Normocephalic, without obvious abnormality,  atraumatic Eyes: negative findings: lids and lashes normal, conjunctivae and sclerae normal and corneas clear Throat: normal findings: lips normal without lesions, buccal mucosa normal, tongue midline and normal and oropharynx pink & moist without lesions or evidence of thrush and abnormal findings: dentition: upper and lower dentures Neck: no adenopathy, supple, symmetrical, trachea midline and thyroid not enlarged, symmetric, no tenderness/mass/nodules Back: no tenderness to percussion or palpation, symmetric, no curvature. ROM normal. No CVA tenderness. Lungs: clear to auscultation bilaterally and normal percussion bilaterally Heart: regular rate and rhythm and systolic ejection murmur ausculated 3/6 heard best at LSB 2nd ICS Abdomen: soft, non-tender; bowel sounds normal; no masses,  no organomegaly Extremities: extremities normal, atraumatic, no cyanosis or edema Skin: Skin color, texture, turgor normal. No rashes or lesions Lymph nodes: Cervical, supraclavicular, and axillary nodes normal. Neurologic:  Grossly normal  LABORATORY DATA:  No results found for this or any previous visit (from the past 48 hour(s)).         RADIOGRAPHY:  PATHOLOGY:  N/A   ASSESSMENT/PLAN:   Proteinuria M spike on UPEP Anemia CKD Thrombocytopenia  Proteinuria with M-spike detected on 24 hour urine collection equalling 80.4 mg/24 hours in the setting of Stage IV chronic renal disease followed by Fran Lowes, MD (Nephrology).  Labs today: CBC diff, CMET, LDH, ESR, Retic Count, anemia panel, haptoglobin, SPEP+IFE, IgG, IgA, IgM, light chain assay, B2M.  Skeletal survey is also ordered today.  He will have this completed today.  He is provided elementary education regarding an M-spike.  Patient also have significant anemia/thrombocytopenia. His anemia may be secondary to his CKD however anemia panel will be pursued today as well. This was discussed with the patient today. I did address with him that I suspect we may ultimately need to pursue a BMBX but will address at follow-up.  He will return in 2-3 weeks for follow-up to review data and further medical oncology recommendations.   ORDERS PLACED FOR THIS ENCOUNTER: Orders Placed This Encounter  Procedures  . DG Bone Survey Met  . CBC with Differential  . Comprehensive metabolic panel  . Lactate dehydrogenase  . Sedimentation rate  . Reticulocytes  . Vitamin B12  . Folate  . Iron and TIBC  . Ferritin  . Haptoglobin  . Kappa/lambda light chains  . Beta 2 microglobuline, serum  . IgG, IgA, IgM  . Immunofixation electrophoresis  . Protein electrophoresis, serum    All questions were answered. The patient knows to call the clinic with any problems, questions or concerns. We can certainly see the patient much sooner if necessary. This note is electronically signed [email or web address] Cyril Mourning, MD  03/23/2016 6:14 PM

## 2016-03-23 NOTE — Assessment & Plan Note (Addendum)
Proteinuria with M-spike detected on 24 hour urine collection equalling 80.4 mg/24 hours in the setting of Stage IV chronic renal disease followed by Fran Lowes, MD (Nephrology).  Labs today: CBC diff, CMET, LDH, ESR, Retic Count, anemia panel, haptoglobin, SPEP+IFE, IgG, IgA, IgM, light chain assay, B2M.  Skeletal survey is also ordered today.  He will have this completed today.  He is provided elementary education regarding an M-spike.  He will return in 2-3 weeks for follow-up to review data and further medical oncology recommendations.

## 2016-03-24 LAB — IMMUNOFIXATION ELECTROPHORESIS
IGA: 80 mg/dL (ref 61–437)
IGG (IMMUNOGLOBIN G), SERUM: 1630 mg/dL — AB (ref 700–1600)
IGM, SERUM: 29 mg/dL (ref 15–143)
Total Protein ELP: 7.2 g/dL (ref 6.0–8.5)

## 2016-03-24 LAB — IGG, IGA, IGM
IGG (IMMUNOGLOBIN G), SERUM: 1583 mg/dL (ref 700–1600)
IgA: 83 mg/dL (ref 61–437)
IgM, Serum: 33 mg/dL (ref 15–143)

## 2016-03-24 LAB — KAPPA/LAMBDA LIGHT CHAINS
KAPPA FREE LGHT CHN: 374.6 mg/L — AB (ref 3.3–19.4)
Kappa, lambda light chain ratio: 16.43 — ABNORMAL HIGH (ref 0.26–1.65)
LAMDA FREE LIGHT CHAINS: 22.8 mg/L (ref 5.7–26.3)

## 2016-03-24 LAB — PROTEIN ELECTROPHORESIS, SERUM
A/G Ratio: 1 (ref 0.7–1.7)
Albumin ELP: 3.5 g/dL (ref 2.9–4.4)
Alpha-1-Globulin: 0.3 g/dL (ref 0.0–0.4)
Alpha-2-Globulin: 0.8 g/dL (ref 0.4–1.0)
Beta Globulin: 0.8 g/dL (ref 0.7–1.3)
Gamma Globulin: 1.6 g/dL (ref 0.4–1.8)
Globulin, Total: 3.5 g/dL (ref 2.2–3.9)
M-Spike, %: 1.1 g/dL — ABNORMAL HIGH
Total Protein ELP: 7 g/dL (ref 6.0–8.5)

## 2016-03-24 LAB — BETA 2 MICROGLOBULIN, SERUM: Beta-2 Microglobulin: 4.5 mg/L — ABNORMAL HIGH (ref 0.6–2.4)

## 2016-03-24 LAB — FOLATE: Folate: 60.2 ng/mL (ref >5.9)

## 2016-03-24 LAB — HAPTOGLOBIN: HAPTOGLOBIN: 208 mg/dL — AB (ref 34–200)

## 2016-03-25 ENCOUNTER — Ambulatory Visit (INDEPENDENT_AMBULATORY_CARE_PROVIDER_SITE_OTHER): Payer: Medicare Other | Admitting: Urology

## 2016-03-25 DIAGNOSIS — C669 Malignant neoplasm of unspecified ureter: Secondary | ICD-10-CM

## 2016-04-01 ENCOUNTER — Ambulatory Visit (INDEPENDENT_AMBULATORY_CARE_PROVIDER_SITE_OTHER): Payer: Medicare Other | Admitting: Urology

## 2016-04-01 DIAGNOSIS — C669 Malignant neoplasm of unspecified ureter: Secondary | ICD-10-CM

## 2016-04-08 ENCOUNTER — Ambulatory Visit (INDEPENDENT_AMBULATORY_CARE_PROVIDER_SITE_OTHER): Payer: Medicare Other | Admitting: Urology

## 2016-04-08 DIAGNOSIS — C669 Malignant neoplasm of unspecified ureter: Secondary | ICD-10-CM

## 2016-04-14 ENCOUNTER — Encounter (HOSPITAL_COMMUNITY): Payer: Medicare Other | Attending: Oncology | Admitting: Hematology & Oncology

## 2016-04-14 ENCOUNTER — Encounter (HOSPITAL_COMMUNITY): Payer: Self-pay | Admitting: Hematology & Oncology

## 2016-04-14 VITALS — BP 135/44 | HR 68 | Temp 98.0°F | Resp 16 | Wt 160.1 lb

## 2016-04-14 DIAGNOSIS — Z8559 Personal history of malignant neoplasm of other urinary tract organ: Secondary | ICD-10-CM | POA: Diagnosis not present

## 2016-04-14 DIAGNOSIS — D890 Polyclonal hypergammaglobulinemia: Secondary | ICD-10-CM

## 2016-04-14 DIAGNOSIS — E538 Deficiency of other specified B group vitamins: Secondary | ICD-10-CM | POA: Diagnosis not present

## 2016-04-14 DIAGNOSIS — D472 Monoclonal gammopathy: Secondary | ICD-10-CM | POA: Diagnosis not present

## 2016-04-14 DIAGNOSIS — N184 Chronic kidney disease, stage 4 (severe): Secondary | ICD-10-CM

## 2016-04-14 DIAGNOSIS — N189 Chronic kidney disease, unspecified: Secondary | ICD-10-CM | POA: Diagnosis not present

## 2016-04-14 DIAGNOSIS — R809 Proteinuria, unspecified: Secondary | ICD-10-CM

## 2016-04-14 DIAGNOSIS — D649 Anemia, unspecified: Secondary | ICD-10-CM

## 2016-04-14 NOTE — Patient Instructions (Signed)
[place] at [facility] Discharge Instructions  RECOMMENDATIONS MADE BY THE CONSULTANT AND ANY TEST RESULTS WILL BE SENT TO YOUR REFERRING PHYSICIAN.  You saw Dr.Penland today. Bone Marrow biopsy after Christmas. Follow up with MD 2 weeks after Biopsy. See Amy at checkout for appointments.  Thank you for choosing [place] at [facility] to provide your oncology and hematology care.  To afford each patient quality time with our provider, please arrive at least 15 minutes before your scheduled appointment time.   Beginning January 23rd 2017 lab work for the [facility] will be done in the  Main lab at [facility] on 1st floor. If you have a lab appointment with the [place] please come in thru the  Main Entrance and check in at the main information desk  You need to re-schedule your appointment should you arrive 10 or more minutes late.  We strive to give you quality time with our providers, and arriving late affects you and other patients whose appointments are after yours.  Also, if you no show three or more times for appointments you may be dismissed from the clinic at the providers discretion.     Again, thank you for choosing [facility].  Our hope is that these requests will decrease the amount of time that you wait before being seen by our physicians.       _____________________________________________________________  Should you have questions after your visit to [facility], please contact our office at [phone] between the hours of 8:30 a.m. and 4:30 p.m.  Voicemails left after 4:30 p.m. will not be returned until the following business day.  For prescription refill requests, have your pharmacy contact our office.         Resources For Cancer Patients and their Caregivers ? American Cancer Society: Can assist with transportation, wigs, general needs, runs Look Good Feel Better.         (626) 135-3449 ? Cancer Care: Provides financial assistance, online support groups, medication/co-pay assistance.  1-800-813-HOPE 902-239-2301) ? [place] Assists [place] cancer patients and their families through emotional , educational and financial support.  587-755-7715 ? [place] DSS Where to apply for food stamps, Medicaid and utility assistance. 3041856120 ? [facility]: Transportation to medical appointments. 431-835-3208 ? Social Security Administration: May apply for disability if have a Stage IV cancer. 737-078-9895 (518) 453-4506 ? [employer], Disability and Transit Services: Assists with nutrition, care and transit needs. [place] Support Programs: '@10RELATIVEDAYS' @ > Cancer Support Group  2nd Tuesday of the month 1pm-2pm, Journey Room  > Creative Journey  3rd Tuesday of the month 1130am-1pm, Journey Room  > Look Good Feel Better  1st Wednesday of the month 10am-12 noon, Journey Room (Call [place] to register [phone])

## 2016-04-14 NOTE — Progress Notes (Signed)
[facility] Hematology/Oncology Consultation   Name: Brian [name]      MRN: [id number]   Date: 04/14/2016 Time:12:22 PM   REFERRING PHYSICIAN:  Fran Lowes, MD (Nephrology)  REASON FOR CONSULT:  "M-Spike on 24 Hour urine for protein-immunoelectrophoresis"   DIAGNOSIS:  M-spike in urine  HISTORY OF PRESENT ILLNESS:   Brian [name] is a 82 y.o. male with a medical history significant for aortic stenosis, CAD, HTN, hyperlipidemia, GERD, invasive urothelial cancer having undergone a transurethral resection of tumor on 02/19/2016 by Dr. Alyson Ingles (urology) who is referred to the [facility] for a detected M-spike in his 24 hour urine collection.  Patient is unaccompanied today. He presents to review laboratory studies from his initial visit.  He denies SOB. He does note fatigue.  He has several treatments of BCG left with Dr. Alyson Ingles for his bladder cancer. He notes that he is doing well.   Review of Systems  Constitutional: Positive for weight loss (12 lbs x 6-12 months). Negative for chills, fever and malaise/fatigue.  HENT: Negative.   Eyes: Negative.  Negative for double vision.  Respiratory: Negative.  Negative for cough, hemoptysis, sputum production and shortness of breath.   Cardiovascular: Negative.  Negative for chest pain and leg swelling.  Gastrointestinal: Negative.  Negative for abdominal pain, constipation, diarrhea, nausea and vomiting.  Genitourinary: Negative.   Musculoskeletal: Negative.  Negative for myalgias.  Skin: Negative.   Neurological: Negative for focal weakness, weakness and headaches.  Endo/Heme/Allergies: Negative.   Psychiatric/Behavioral: Negative.   14 point review of systems was performed and is negative except as detailed under history of present illness and above  PAST MEDICAL HISTORY:   Past Medical History:  Diagnosis Date  . Aortic stenosis 3/11   Very mild, possibly bicuspid valve  . Chronic  renal disease, stage 4, severely decreased glomerular filtration rate (GFR) between 15-29 mL/min/1.73 square meter (HCC) 03/23/2016  . Coronary atherosclerosis of native coronary artery 5/11   Multivessel, DES circumflex 5/11, 50% LAD, occluded RCA, LVEF 60%  . Essential hypertension, benign   . GERD (gastroesophageal reflux disease)   . Proteinuria 03/23/2016    ALLERGIES: Allergies  Allergen Reactions  . Codeine     REACTION: nausea      MEDICATIONS: I have reviewed the patient's current medications.    Current Outpatient Prescriptions on File Prior to Visit  Medication Sig Dispense Refill  . amLODipine (NORVASC) 10 MG tablet Take 10 mg by mouth daily.    Marland Kitchen aspirin 81 MG tablet Take 81 mg by mouth daily.      Marland Kitchen atorvastatin (LIPITOR) 40 MG tablet Take 1 tablet (40 mg total) by mouth daily. 90 tablet 3  . famotidine (PEPCID) 20 MG tablet Take 20 mg by mouth daily as needed for heartburn or indigestion.     . nitroGLYCERIN (NITROSTAT) 0.4 MG SL tablet Place 0.4 mg under the tongue every 5 (five) minutes as needed. TAKE AS DIRECTED FOR CHEST PAIN, NOT TO EXCEED 3 DOSES.     Marland Kitchen traMADol (ULTRAM) 50 MG tablet Take 1 tablet (50 mg total) by mouth every 6 (six) hours as needed. 30 tablet 0  . valsartan (DIOVAN) 320 MG tablet Take 320 mg by mouth daily.     No current facility-administered medications on file prior to visit.      PAST SURGICAL HISTORY Past Surgical History:  Procedure Laterality Date  . BIOPSY N/A 01/15/2016   Procedure: BLADDER  BIOPSY WITH FULGERATION;  Surgeon: Cleon Gustin, MD;  Location: [facility];  Service: Urology;  Laterality: N/A;  . CATARACT EXTRACTION     BILATERAL  . CORONARY ANGIOPLASTY     stent   4 years ago  . CORONARY ARTERY BYPASS GRAFT    . CYSTOSCOPY W/ RETROGRADES Right 01/15/2016   Procedure: CYSTOSCOPY WITH RIGHT RETROGRADE PYELOGRAM;  Surgeon: Cleon Gustin, MD;  Location: [facility];  Service: Urology;  Laterality: Right;  . CYSTOSCOPY W/  RETROGRADES Right 02/19/2016   Procedure: CYSTOSCOPY WITH RIGHT RETROGRADE PYELOGRAM;  Surgeon: Cleon Gustin, MD;  Location: [facility];  Service: Urology;  Laterality: Right;  . CYSTOSCOPY WITH STENT PLACEMENT Right 01/15/2016   Procedure: CYSTOSCOPY  WITH RIGHT STENT PLACEMENT;  Surgeon: Cleon Gustin, MD;  Location: [facility];  Service: Urology;  Laterality: Right;  . CYSTOSCOPY WITH STENT PLACEMENT Right 02/19/2016   Procedure: CYSTOSCOPY WITH RIGHT URETERA; STENT EXCHANGE;  Surgeon: Cleon Gustin, MD;  Location: [facility];  Service: Urology;  Laterality: Right;  . URETEROSCOPY Right 01/15/2016   Procedure: RIGHT URETEROSCOPY WITH BIOPSY;  Surgeon: Cleon Gustin, MD;  Location: [facility];  Service: Urology;  Laterality: Right;    FAMILY HISTORY: History reviewed. No pertinent family history.  He has 5 children, 1 daughter deceased from MI at the age of 32. He has 1 son who had part or all of his kidney removed for a malignancy.  SOCIAL HISTORY:  reports that he quit smoking about 27 years ago. His smoking use included Cigarettes. He has a 80.00 pack-year smoking history. He has never used smokeless tobacco. He reports that he does not drink alcohol or use drugs.  He is divorced, but remarried x 48 years.  His wife's age is 19 and in good health. He is a Engineer, manufacturing.  He is retired from [employer].  Social History   Social History  . Marital status: Married    Spouse name: N/A  . Number of children: N/A  . Years of education: N/A   Occupational History  . Retired Retired    [employer]  . Smoking status: Former Smoker    Packs/day: 2.00    Years: 40.00    Types: Cigarettes    Quit date: 07/01/1988  . Smokeless tobacco: Never Used  . Alcohol use No  . Drug use: No  . Sexual activity: No   Other Topics Concern  . None   Social History Narrative  . None   PERFORMANCE STATUS: The patient's performance status is 1 - Symptomatic but  completely ambulatory  PHYSICAL EXAM: Most Recent Vital Signs: Blood pressure (!) 135/44, pulse 68, temperature 98 F (36.7 C), temperature source Oral, resp. rate 16, weight 160 lb 1.6 oz (72.6 kg), SpO2 100 %. General appearance: alert, cooperative, appears stated age, no distress and unaccompanied Head: Normocephalic, without obvious abnormality, atraumatic Eyes: negative findings: lids and lashes normal, conjunctivae and sclerae normal and corneas clear Throat: normal findings: lips normal without lesions, buccal mucosa normal, tongue midline and normal and oropharynx pink & moist without lesions or evidence of thrush and abnormal findings: dentition: upper and lower dentures Neck: no adenopathy, supple, symmetrical, trachea midline and thyroid not enlarged, symmetric, no tenderness/mass/nodules Back: no tenderness to percussion or palpation, symmetric, no curvature. ROM normal. No CVA tenderness. Lungs: clear to auscultation bilaterally and normal percussion bilaterally Heart: regular rate and rhythm and systolic ejection murmur ausculated 3/6 heard best  at LSB 2nd ICS Abdomen: soft, non-tender; bowel sounds normal; no masses,  no organomegaly Extremities: extremities normal, atraumatic, no cyanosis or edema Skin: Skin color, texture, turgor normal. No rashes or lesions Lymph nodes: Cervical, supraclavicular, and axillary nodes normal. Neurologic: Grossly normal  LABORATORY DATA:  No results found for this or any previous visit (from the past 48 hour(s)).   Results for FABION, GATSON (MRN [id number]) as of 04/14/2016 18:11  Ref. Range 03/23/2016 13:13  Sodium Latest Ref Range: 135 - 145 mmol/L 137  Potassium Latest Ref Range: 3.5 - 5.1 mmol/L 4.5  Chloride Latest Ref Range: 101 - 111 mmol/L 110  CO2 Latest Ref Range: 22 - 32 mmol/L 23  BUN Latest Ref Range: 6 - 20 mg/dL 42 (H)  Creatinine Latest Ref Range: 0.61 - 1.24 mg/dL 2.19 (H)  Calcium Latest Ref Range: 8.9 - 10.3 mg/dL 8.6  (L)  EGFR (Non-African Amer.) Latest Ref Range: >60 mL/min 26 (L)  EGFR (African American) Latest Ref Range: >60 mL/min 30 (L)  Glucose Latest Ref Range: 65 - 99 mg/dL 100 (H)  Anion gap Latest Ref Range: 5 - 15  4 (L)  Alkaline Phosphatase Latest Ref Range: 38 - 126 U/L 90  Albumin Latest Ref Range: 3.5 - 5.0 g/dL 3.9  AST Latest Ref Range: 15 - 41 U/L 21  ALT Latest Ref Range: 17 - 63 U/L 11 (L)  Total Protein Latest Ref Range: 6.5 - 8.1 g/dL 7.4  Total Bilirubin Latest Ref Range: 0.3 - 1.2 mg/dL 0.4  LDH Latest Ref Range: 98 - 192 U/L 165  Iron Latest Ref Range: 45 - 182 ug/dL 54  UIBC Latest Units: ug/dL 211  TIBC Latest Ref Range: 250 - 450 ug/dL 265  Saturation Ratios Latest Ref Range: 17.9 - 39.5 % 20  Ferritin Latest Ref Range: 24 - 336 ng/mL 48  Folate Latest Ref Range: >5.9 ng/mL 60.2  Beta-2 Microglobulin Latest Ref Range: 0.6 - 2.4 mg/L 4.5 (H)  Vitamin B12 Latest Ref Range: 180 - 914 pg/mL 296   Results for KHALEED, HOLAN (MRN [id number]) as of 04/14/2016 18:11  Ref. Range 03/23/2016 13:13 03/23/2016 13:14  IgG (Immunoglobin G), Serum Latest Ref Range: 700 - 1,600 mg/dL 1,583 1,630 (H)  IgA Latest Ref Range: 61 - 437 mg/dL 83 80  IgM, Serum Latest Ref Range: 15 - 143 mg/dL 33 29  Kappa free light chain Latest Ref Range: 3.3 - 19.4 mg/L 374.6 (H)   Lamda free light chains Latest Ref Range: 5.7 - 26.3 mg/L 22.8   Kappa, lamda light chain ratio Latest Ref Range: 0.26 - 1.65  16.43 (H)   WBC Latest Ref Range: 4.0 - 10.5 K/uL 5.2   RBC Latest Ref Range: 4.22 - 5.81 MIL/uL 2.49 (L)   Hemoglobin Latest Ref Range: 13.0 - 17.0 g/dL 8.5 (L)   HCT Latest Ref Range: 39.0 - 52.0 % 25.4 (L)   MCV Latest Ref Range: 78.0 - 100.0 fL 102.0 (H)   MCH Latest Ref Range: 26.0 - 34.0 pg 34.1 (H)   MCHC Latest Ref Range: 30.0 - 36.0 g/dL 33.5   RDW Latest Ref Range: 11.5 - 15.5 % 13.5   Platelets Latest Ref Range: 150 - 400 K/uL 115 (L)   Neutrophils Latest Units: % 58   Lymphocytes  Latest Units: % 29   Monocytes Relative Latest Units: % 9   Eosinophil Latest Units: % 3   Basophil Latest Units: % 1   NEUT# Latest Ref  Range: 1.7 - 7.7 K/uL 3.0   Lymphocyte # Latest Ref Range: 0.7 - 4.0 K/uL 1.5   Monocyte # Latest Ref Range: 0.1 - 1.0 K/uL 0.5   Eosinophils Absolute Latest Ref Range: 0.0 - 0.7 K/uL 0.1   Basophils Absolute Latest Ref Range: 0.0 - 0.1 K/uL 0.0   RBC. Latest Ref Range: 4.22 - 5.81 MIL/uL 2.49 (L)   Retic Ct Pct Latest Ref Range: 0.4 - 3.1 % 1.4   Retic Count, Manual Latest Ref Range: 19.0 - 186.0 K/uL 34.9   Haptoglobin Latest Ref Range: 34 - 200 mg/dL 208 (H)   Sed Rate Latest Ref Range: 0 - 16 mm/hr 50 (H)   Glucose Latest Ref Range: 65 - 99 mg/dL 100 (H)     RADIOGRAPHY: No results found.     PATHOLOGY:     ASSESSMENT/PLAN:  Anemia CKD MGUS Low serum B12 Urothelial carcinoma  We reviewed his anemia and potential causes including his CKD. I discussed potential benefit in the future of growth factor support with iron replacement. He does not significant fatigue and this may benefit him.  Given the severity of his anemia, his CKD and M spike I have recommended a BMBX for completeness. He is agreeable. He was given information on MGUS today. We will regroup post marrow to review and for additional recommendations.  He notes that he was on B12 replacement in the past IM, then converted to oral B12. He is no longer on either. I have encouraged him to restart oral B12, we will monitor his levels moving forward.  A bone marrow aspiration and biopsy is indicated in all patients with an M protein ?1.5 g/dL, patients with IgA MGUS of any size, patients with an abnormal serum free light chain ratio (ie, ratio of kappa to lambda free light chains <0.26 or >1.65), and in all patients who have any abnormalities of the complete blood count (CBC), serum creatinine, serum calcium, or radiographic bone survey. This practice is supported by a study of [id number]  patients with MGUS or multiple myeloma with minimal bone pain (grade 0/1) whose initial work-up included bone marrow evaluation and skeletal survey (without serum free light chain analysis) with the following results [54]:  ?Among patients with an M-protein ?1.5 g/dL, the percentage of patients with bone marrow plasma cell infiltration >10 percent was 7.3 percent overall, but ranged from 4.7 percent to 20 percent in those with IgG and IgA isotypes, respectively.  ?For those with an IgG M-protein <0.5 g/dL, <1 percent had bone marrow plasma cell infiltration >10 percent by morphology.  ?Similarly, among those with an M protein ?1.5 g/dL, the percentage of patients with bone lesions on skeletal survey was <2 percent, irrespective of isotype.   Will follow-up with patient after bone marrow biopsy.   ORDERS PLACED FOR THIS ENCOUNTER: No orders of the defined types were placed in this encounter.  Orders Placed This Encounter  Procedures  . CT Biopsy    Standing Status:   Future    Standing Expiration Date:   04/14/2017    Order Specific Question:   Lab orders requested (DO NOT place separate lab orders, these will be automatically ordered during procedure specimen collection):    Answer:   Surgical Pathology    Order Specific Question:   Reason for Exam (SYMPTOM  OR DIAGNOSIS REQUIRED)    Answer:   bone marrow biopsy with flow cytometry and cytogenetics    Order Specific Question:   Preferred imaging location?  Answer:   [facility]   All questions were answered. The patient knows to call the clinic with any problems, questions or concerns. We can certainly see the patient much sooner if necessary.  This document serves as a record of services personally performed by Ancil Linsey, MD. It was created on her behalf by Elmyra Ricks, a trained medical scribe. The creation of this record is based on the scribe's personal observations and the provider's statements to them. This document has  been checked and approved by the attending provider.  I have reviewed the above documentation for accuracy and completeness and I agree with the above.  This note is electronically signed [email or web address] Cyril Mourning, MD  04/14/2016 12:22 PM

## 2016-04-15 ENCOUNTER — Ambulatory Visit (INDEPENDENT_AMBULATORY_CARE_PROVIDER_SITE_OTHER): Payer: Medicare Other | Admitting: Urology

## 2016-04-15 DIAGNOSIS — C669 Malignant neoplasm of unspecified ureter: Secondary | ICD-10-CM

## 2016-04-19 ENCOUNTER — Encounter (HOSPITAL_COMMUNITY): Payer: Self-pay | Admitting: Oncology

## 2016-04-22 ENCOUNTER — Ambulatory Visit (INDEPENDENT_AMBULATORY_CARE_PROVIDER_SITE_OTHER): Payer: Medicare Other | Admitting: Urology

## 2016-04-22 DIAGNOSIS — C669 Malignant neoplasm of unspecified ureter: Secondary | ICD-10-CM

## 2016-04-22 DIAGNOSIS — Z23 Encounter for immunization: Secondary | ICD-10-CM | POA: Diagnosis not present

## 2016-05-05 ENCOUNTER — Other Ambulatory Visit: Payer: Self-pay | Admitting: General Surgery

## 2016-05-06 ENCOUNTER — Ambulatory Visit (HOSPITAL_COMMUNITY): Admission: RE | Admit: 2016-05-06 | Payer: Medicare Other | Source: Ambulatory Visit

## 2016-05-06 ENCOUNTER — Ambulatory Visit (HOSPITAL_COMMUNITY): Payer: Medicare Other

## 2016-05-13 ENCOUNTER — Ambulatory Visit (INDEPENDENT_AMBULATORY_CARE_PROVIDER_SITE_OTHER): Payer: Medicare Other | Admitting: Urology

## 2016-05-13 DIAGNOSIS — C669 Malignant neoplasm of unspecified ureter: Secondary | ICD-10-CM

## 2016-05-18 ENCOUNTER — Ambulatory Visit (HOSPITAL_COMMUNITY): Payer: Medicare Other | Admitting: Hematology & Oncology

## 2016-05-18 IMAGING — US US RENAL
1 series · 14 of 25 positions shown · non-contrast
Comparison: None.

CLINICAL DATA: Elevated renal laboratory values

EXAM:
RENAL / URINARY TRACT ULTRASOUND COMPLETE

[Series 1: us renal · 0.24mm/px · 14 of 42 slices shown]
[im 1/42]
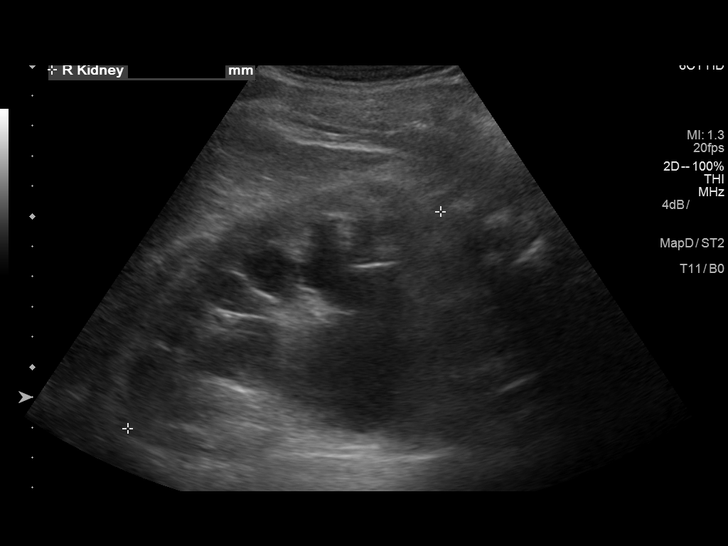
[im 4/42]
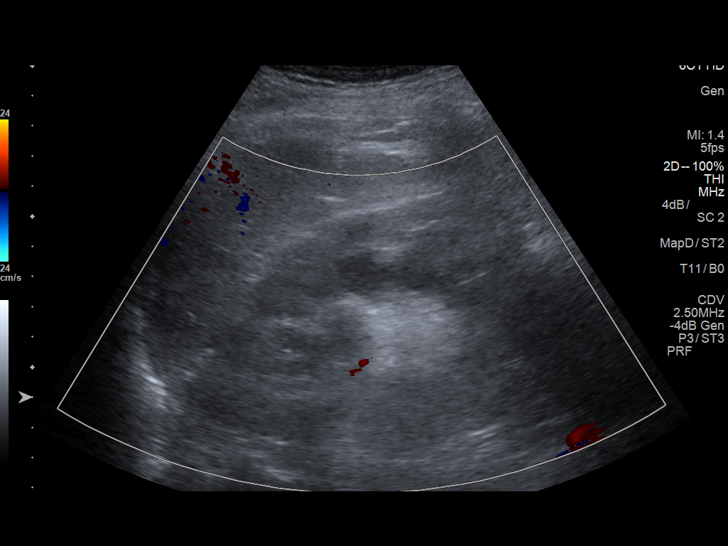
[im 7/42]
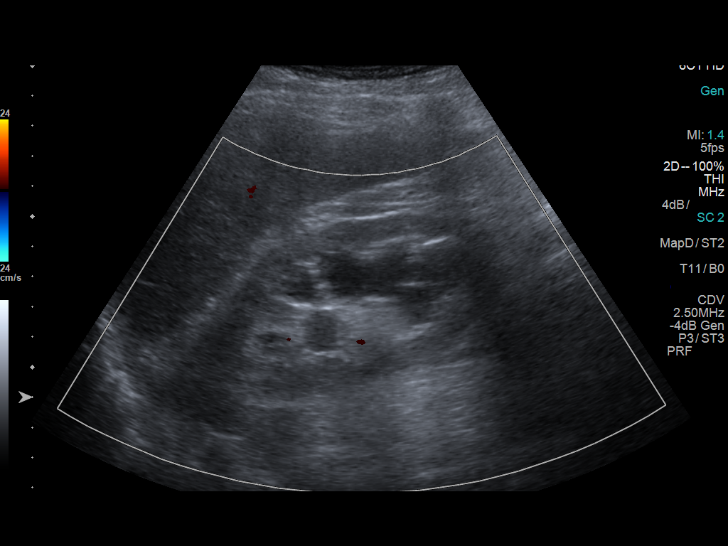
[im 11/42]
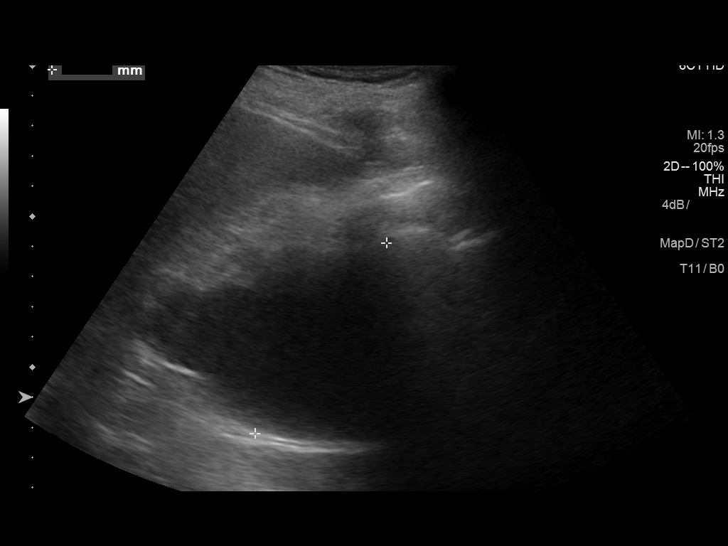
[im 14/42]
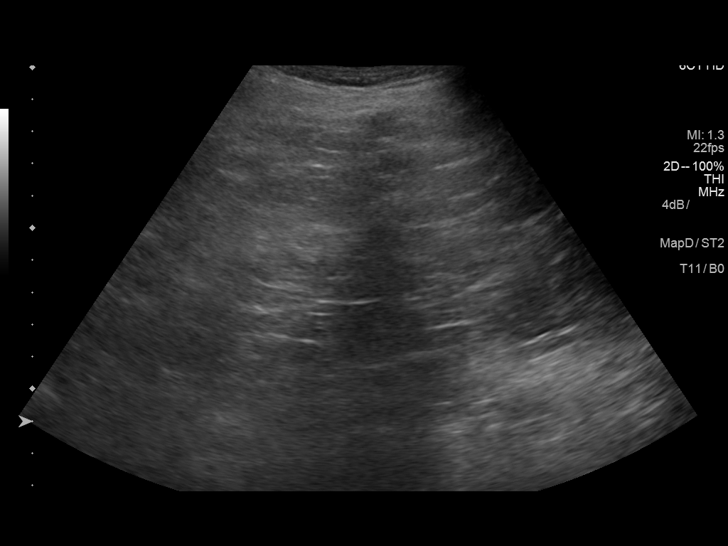
[im 16/42]
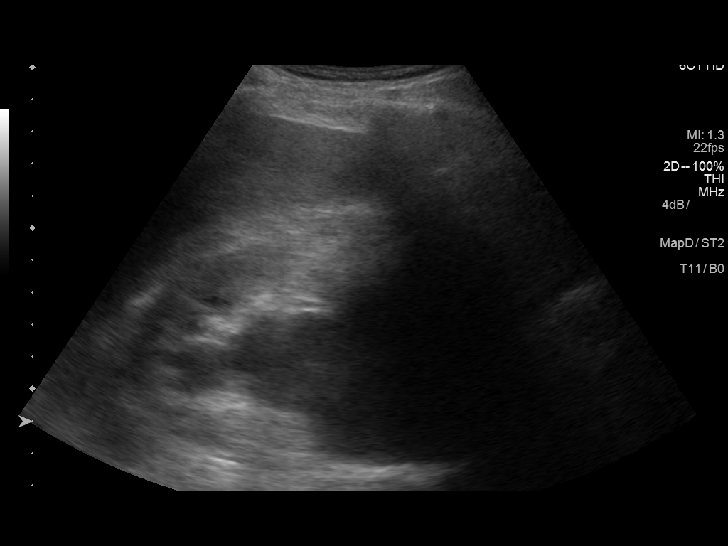
[im 19/42]
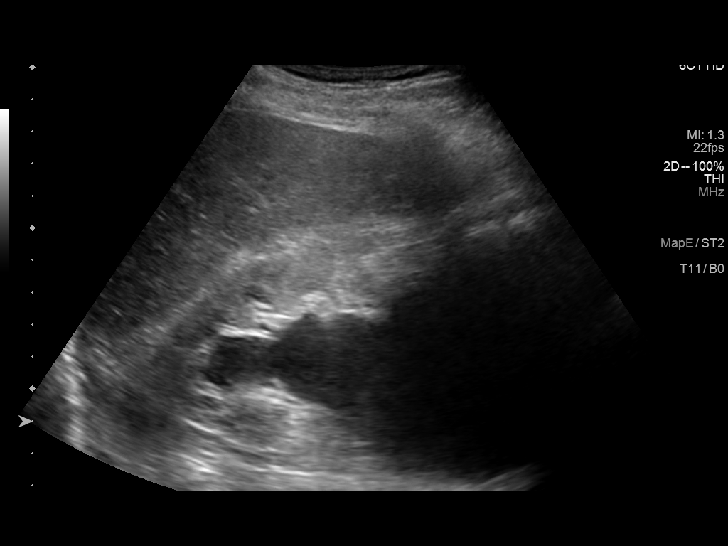
[im 23/42]
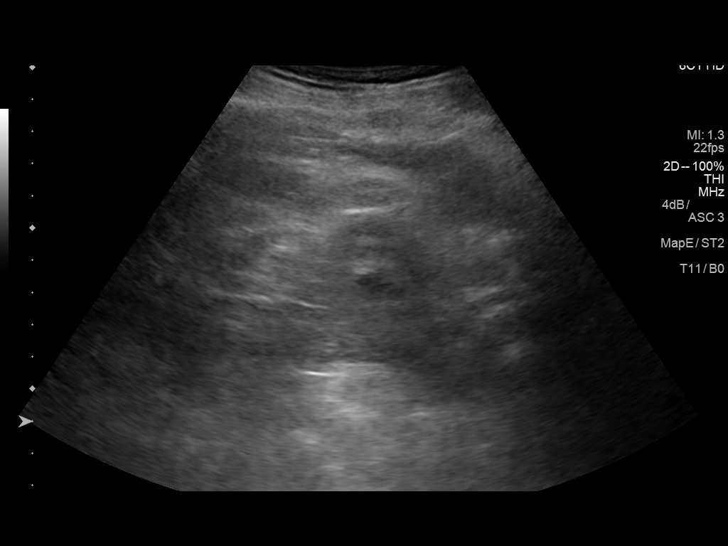
[im 26/42]
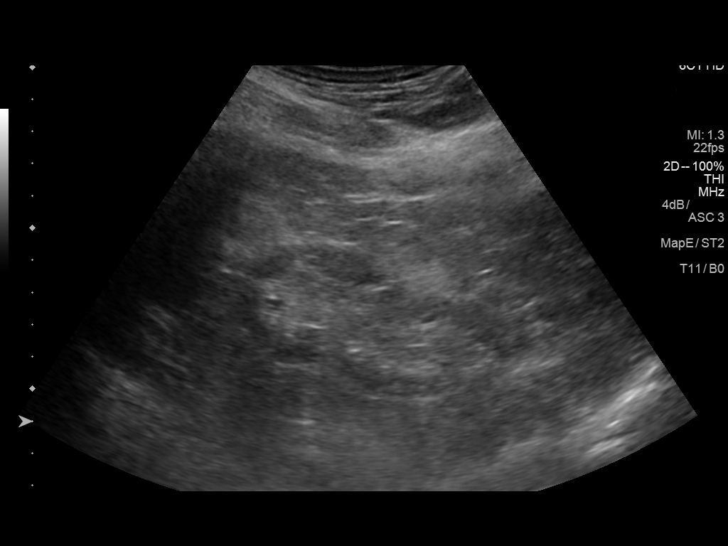
[im 28/42]
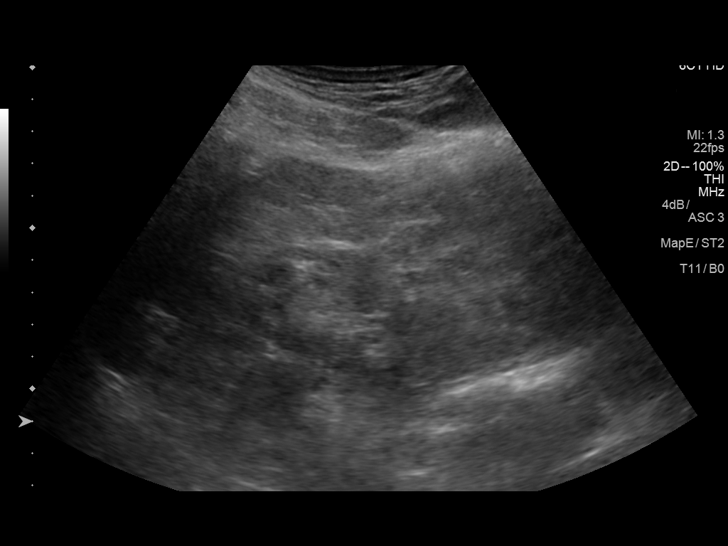
[im 31/42]
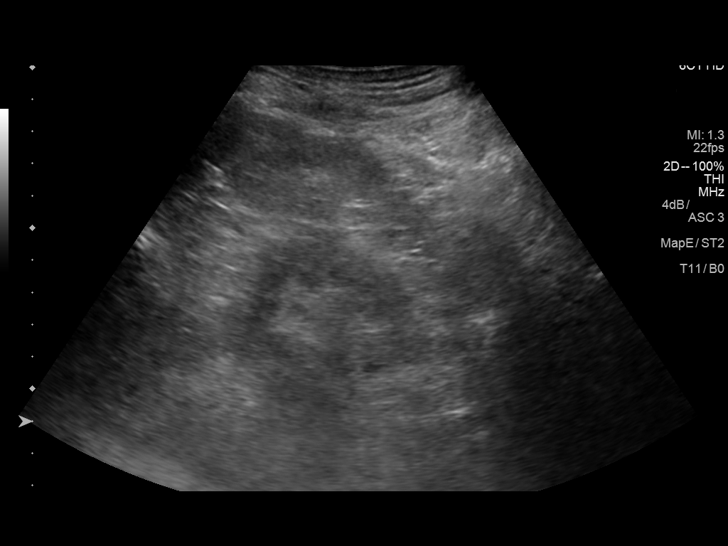
[im 35/42]
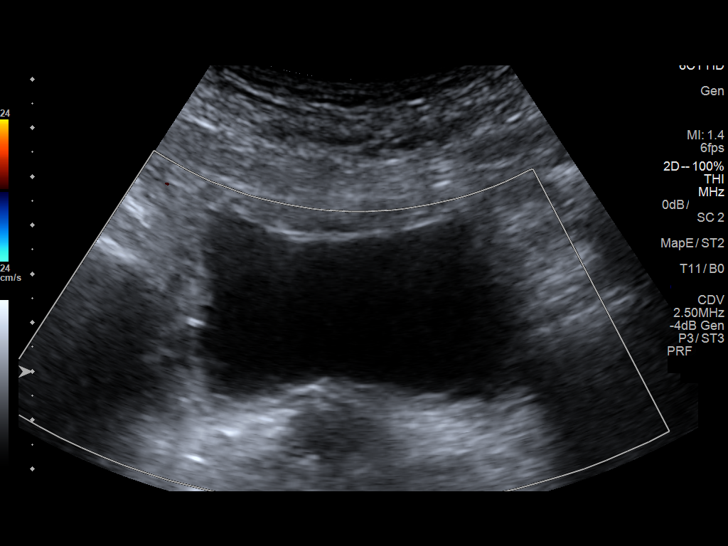
[im 38/42]
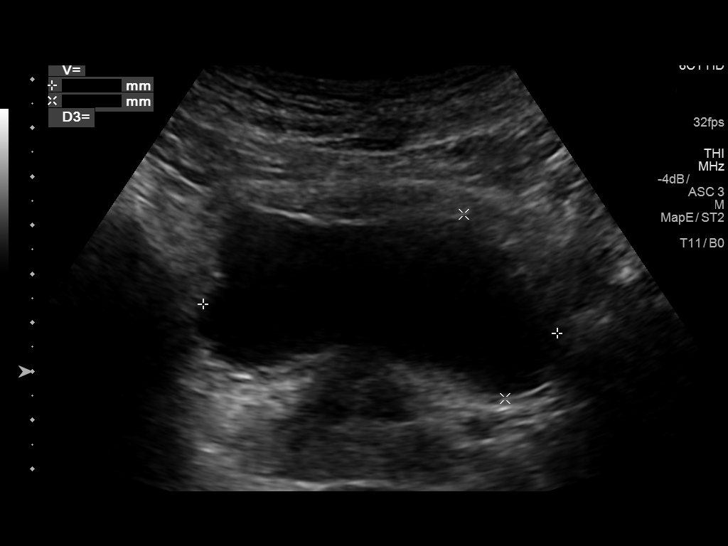
[im 42/42]
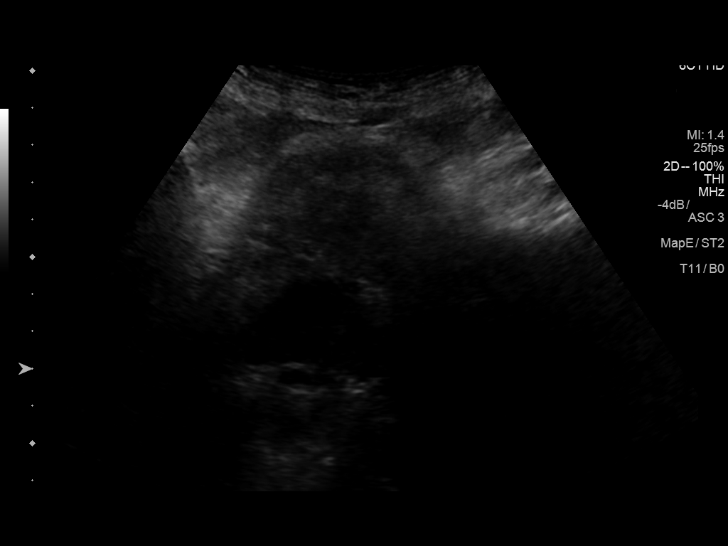

[14 of 25 positions shown; findings below may reference images not displayed]

FINDINGS: Right Kidney:

Length: 12.6 cm.. Moderate right hydronephrosis is present. CT of
the abdomen pelvis with IV contrast media is recommended to assess
for etiology.

Left Kidney:

Length: 9.3 cm.. No hydronephrosis is seen the left kidney appears
slightly atrophic

Bladder:

The urinary bladder is not well distended with prevoid urine volume
of 98 cubic cm. Postvoid volume of 0 cubic cm. Postvoid the right
hydronephrosis persists.
IMPRESSION: 1. Moderate right hydronephrosis which persists postvoid. Recommend
CT of the abdomen pelvis with IV contrast to assess for etiology.
2. Small left kidney which appears somewhat atrophic.

## 2016-05-20 ENCOUNTER — Ambulatory Visit (HOSPITAL_COMMUNITY): Payer: Medicare Other | Admitting: Hematology & Oncology

## 2016-05-29 ENCOUNTER — Other Ambulatory Visit: Payer: Self-pay | Admitting: Urology

## 2016-06-02 ENCOUNTER — Encounter (HOSPITAL_COMMUNITY): Payer: Self-pay | Admitting: Adult Health

## 2016-06-02 ENCOUNTER — Encounter (HOSPITAL_COMMUNITY): Payer: Medicare Other

## 2016-06-02 ENCOUNTER — Encounter (HOSPITAL_COMMUNITY): Payer: Medicare Other | Attending: Adult Health | Admitting: Adult Health

## 2016-06-02 VITALS — BP 129/49 | HR 68 | Temp 97.9°F | Resp 16 | Ht 70.5 in | Wt 157.0 lb

## 2016-06-02 DIAGNOSIS — C661 Malignant neoplasm of right ureter: Secondary | ICD-10-CM

## 2016-06-02 DIAGNOSIS — N184 Chronic kidney disease, stage 4 (severe): Secondary | ICD-10-CM | POA: Insufficient documentation

## 2016-06-02 DIAGNOSIS — D472 Monoclonal gammopathy: Secondary | ICD-10-CM

## 2016-06-02 DIAGNOSIS — C689 Malignant neoplasm of urinary organ, unspecified: Secondary | ICD-10-CM

## 2016-06-02 DIAGNOSIS — E538 Deficiency of other specified B group vitamins: Secondary | ICD-10-CM

## 2016-06-02 DIAGNOSIS — N060 Isolated proteinuria with minor glomerular abnormality: Secondary | ICD-10-CM | POA: Diagnosis not present

## 2016-06-02 DIAGNOSIS — N183 Chronic kidney disease, stage 3 (moderate): Secondary | ICD-10-CM | POA: Diagnosis not present

## 2016-06-02 DIAGNOSIS — D62 Acute posthemorrhagic anemia: Secondary | ICD-10-CM | POA: Diagnosis not present

## 2016-06-02 DIAGNOSIS — D649 Anemia, unspecified: Secondary | ICD-10-CM | POA: Diagnosis not present

## 2016-06-02 LAB — CBC WITH DIFFERENTIAL/PLATELET
BASOS ABS: 0 10*3/uL (ref 0.0–0.1)
BASOS PCT: 1 %
EOS ABS: 0.1 10*3/uL (ref 0.0–0.7)
EOS PCT: 2 %
HCT: 27.4 % — ABNORMAL LOW (ref 39.0–52.0)
Hemoglobin: 9.4 g/dL — ABNORMAL LOW (ref 13.0–17.0)
LYMPHS PCT: 32 %
Lymphs Abs: 1.9 10*3/uL (ref 0.7–4.0)
MCH: 34.7 pg — ABNORMAL HIGH (ref 26.0–34.0)
MCHC: 34.3 g/dL (ref 30.0–36.0)
MCV: 101.1 fL — ABNORMAL HIGH (ref 78.0–100.0)
Monocytes Absolute: 0.5 10*3/uL (ref 0.1–1.0)
Monocytes Relative: 9 %
Neutro Abs: 3.4 10*3/uL (ref 1.7–7.7)
Neutrophils Relative %: 56 %
PLATELETS: 120 10*3/uL — AB (ref 150–400)
RBC: 2.71 MIL/uL — AB (ref 4.22–5.81)
RDW: 14.1 % (ref 11.5–15.5)
WBC: 6 10*3/uL (ref 4.0–10.5)

## 2016-06-02 LAB — COMPREHENSIVE METABOLIC PANEL
ALBUMIN: 3.9 g/dL (ref 3.5–5.0)
ALT: 10 U/L — AB (ref 17–63)
AST: 17 U/L (ref 15–41)
Alkaline Phosphatase: 65 U/L (ref 38–126)
Anion gap: 6 (ref 5–15)
BUN: 42 mg/dL — AB (ref 6–20)
CHLORIDE: 109 mmol/L (ref 101–111)
CO2: 23 mmol/L (ref 22–32)
CREATININE: 2.24 mg/dL — AB (ref 0.61–1.24)
Calcium: 8.8 mg/dL — ABNORMAL LOW (ref 8.9–10.3)
GFR calc Af Amer: 29 mL/min — ABNORMAL LOW (ref >60)
GFR calc non Af Amer: 25 mL/min — ABNORMAL LOW (ref >60)
Glucose, Bld: 157 mg/dL — ABNORMAL HIGH (ref 65–99)
POTASSIUM: 4.5 mmol/L (ref 3.5–5.1)
SODIUM: 138 mmol/L (ref 135–145)
Total Bilirubin: 0.6 mg/dL (ref 0.3–1.2)
Total Protein: 7.4 g/dL (ref 6.5–8.1)

## 2016-06-02 LAB — FERRITIN: Ferritin: 29 ng/mL (ref 24–336)

## 2016-06-02 LAB — IRON AND TIBC
Iron: 65 ug/dL (ref 45–182)
SATURATION RATIOS: 21 % (ref 17.9–39.5)
TIBC: 302 ug/dL (ref 250–450)
UIBC: 237 ug/dL

## 2016-06-02 NOTE — Progress Notes (Signed)
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 male with a medical history significant for aortic stenosis, CAD, HTN, hyperlipidemia, GERD, invasive urothelial cancer having undergone a transurethral resection of tumor on 02/19/2016 by Dr. Alyson [name] (urology) who is referred to the [facility] for a detected M-spike in his 24 hour urine collection.   INTERVAL HISTORY:  Brian [name] presents for continued follow-up for his recent noted M-spike urine electrophoresis; he also has urothelial carcinoma that is currently managed by Dr. Alyson [name] with [facility].    He tells me he last saw Dr. Alyson [name] about 2 weeks ago; he is planning on "going back in to exchange my stent." He has noted trace blood in his urine since his operation, "but it's not too bad."  He did pass a blood clot about 2 weeks ago, but it was not painful.   He was scheduled for bone marrow biopsy on 05/06/16, but this appointment was cancelled; the patient tells me he was out of town at that time and appt was not rescheduled.   He denies any bone pain. Denies any Anant bleeding from his stools, melena, bleeding from his gums or nose.  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    His appetite is good. His energy levels are low, but he feels they are improving.  He has occasional dizziness with standing. He tries to drink plenty of fluids and eat well.      REVIEW OF SYSTEMS:  Review of Systems  Constitutional: Positive for weight loss (~4 lbs since 02/2016). Negative for chills, fever and  malaise/fatigue.  HENT: Positive for hearing loss.   Eyes: Negative.  Negative for double vision.  Respiratory: Negative.  Negative for cough, hemoptysis, sputum production and shortness of breath.   Cardiovascular: Negative.  Negative for chest pain and leg swelling.  Gastrointestinal: Negative.  Negative for abdominal pain, blood in stool, constipation, diarrhea, melena, nausea and vomiting.  Genitourinary: Positive for hematuria. Negative for dysuria.  Musculoskeletal: Negative.  Negative for myalgias.  Skin: Negative.   Neurological: Positive for dizziness (dizziness with standing/changing positions too quickly). Negative for focal weakness, weakness and headaches.  Endo/Heme/Allergies: Bruises/bleeds easily (Endorses bruising easily).  Psychiatric/Behavioral: Negative.   14 point review of systems was performed and is negative except as detailed under history of present illness and above    PAST MEDICAL/SURGICAL HISTORY:   Past Medical History:  Diagnosis Date  . Aortic stenosis 3/11   Very mild, possibly bicuspid valve  . Chronic renal disease, stage 4, severely decreased glomerular filtration rate (GFR) between 15-29 mL/min/1.73 square meter (HCC) 03/23/2016  . Coronary atherosclerosis of native coronary artery 5/11   Multivessel, DES circumflex 5/11, 50% LAD, occluded RCA, LVEF 60%  . Essential hypertension, benign   . GERD (gastroesophageal reflux disease)   . Proteinuria 03/23/2016   Past Surgical History:  Procedure Laterality Date  . BIOPSY N/A 01/15/2016   Procedure: BLADDER BIOPSY WITH FULGERATION;  Surgeon: Cleon Gustin, MD;  Location: [facility];  Service: Urology;  Laterality:  N/A;  . CATARACT EXTRACTION     BILATERAL  . CORONARY ANGIOPLASTY     stent   4 years ago  . CORONARY ARTERY BYPASS GRAFT    . CYSTOSCOPY W/ RETROGRADES Right 01/15/2016   Procedure: CYSTOSCOPY WITH RIGHT RETROGRADE PYELOGRAM;  Surgeon: Cleon Gustin, MD;  Location: [facility];  Service:  Urology;  Laterality: Right;  . CYSTOSCOPY W/ RETROGRADES Right 02/19/2016   Procedure: CYSTOSCOPY WITH RIGHT RETROGRADE PYELOGRAM;  Surgeon: Cleon Gustin, MD;  Location: [facility];  Service: Urology;  Laterality: Right;  . CYSTOSCOPY WITH STENT PLACEMENT Right 01/15/2016   Procedure: CYSTOSCOPY  WITH RIGHT STENT PLACEMENT;  Surgeon: Cleon Gustin, MD;  Location: [facility];  Service: Urology;  Laterality: Right;  . CYSTOSCOPY WITH STENT PLACEMENT Right 02/19/2016   Procedure: CYSTOSCOPY WITH RIGHT URETERA; STENT EXCHANGE;  Surgeon: Cleon Gustin, MD;  Location: [facility];  Service: Urology;  Laterality: Right;  . URETEROSCOPY Right 01/15/2016   Procedure: RIGHT URETEROSCOPY WITH BIOPSY;  Surgeon: Cleon Gustin, MD;  Location: [facility];  Service: Urology;  Laterality: Right;   ALLERGIES: Allergies  Allergen Reactions  . Codeine     REACTION: nausea      MEDICATIONS: I have reviewed the patient's current medications.    Current Outpatient Prescriptions on File Prior to Visit  Medication Sig Dispense Refill  . amLODipine (NORVASC) 10 MG tablet Take 10 mg by mouth daily.    Marland Kitchen aspirin 81 MG tablet Take 81 mg by mouth daily.      Marland Kitchen atorvastatin (LIPITOR) 40 MG tablet Take 1 tablet (40 mg total) by mouth daily. 90 tablet 3  . famotidine (PEPCID) 20 MG tablet Take 20 mg by mouth daily as needed for heartburn or indigestion.     . nitroGLYCERIN (NITROSTAT) 0.4 MG SL tablet Place 0.4 mg under the tongue every 5 (five) minutes as needed. TAKE AS DIRECTED FOR CHEST PAIN, NOT TO EXCEED 3 DOSES.     Marland Kitchen traMADol (ULTRAM) 50 MG tablet Take 1 tablet (50 mg total) by mouth every 6 (six) hours as needed. 30 tablet 0  . valsartan (DIOVAN) 320 MG tablet Take 320 mg by mouth daily.     No current facility-administered medications on file prior to visit.     FAMILY HISTORY: No family history on file.  He has 5 children, 1 daughter deceased from MI at the age of 1. He has 1 son who had part or all  of his kidney removed for a malignancy.  SOCIAL HISTORY:  reports that he quit smoking about 27 years ago. His smoking use included Cigarettes. He has a 80.00 pack-year smoking history. He has never used smokeless tobacco. He reports that he does not drink alcohol or use drugs.  He is divorced, but remarried x 48 years.  His wife's age is 57 and in good health. He is a Engineer, manufacturing.  He is retired from [employer].  Social History   Social History  . Marital status: Married    Spouse name: N/A  . Number of children: N/A  . Years of education: N/A   Occupational History  . Retired Retired    [employer] and [employer]  . Smoking status: Former Smoker    Packs/day: 2.00    Years: 40.00    Types: Cigarettes    Quit date: 07/01/1988  . Smokeless tobacco: Never Used  . Alcohol use No  . Drug use: No  .  Sexual activity: No   Other Topics Concern  . Not on file   Social History Narrative  . No narrative on file   PERFORMANCE STATUS: The patient's performance status is 1 - Symptomatic but completely ambulatory  PHYSICAL EXAM: Vitals:  Vitals:   06/02/16 1407  BP: (!) 129/49  Pulse: 68  Resp: 16  Temp: 97.9 F (36.6 C)   Filed Weights   06/02/16 1407  Weight: 157 lb (71.2 kg)   Physical Exam  Constitutional: He is oriented to person, place, and time and well-developed, well-nourished, and in no distress.  HENT:  Head: Normocephalic.  Right Ear: External ear normal.  Left Ear: External ear normal.  Mouth/Throat: No oropharyngeal exudate.  Eyes: Conjunctivae are normal. Pupils are equal, round, and reactive to light. No scleral icterus.  Neck: Normal range of motion. Neck supple.  Cardiovascular: Normal rate and regular rhythm.   Murmur (mild murmur) heard. Pulmonary/Chest: Effort normal and breath sounds normal.  Abdominal: Soft. Bowel sounds are normal. He exhibits no distension. There is no tenderness. There is no rebound.  Musculoskeletal: Normal  range of motion. He exhibits no edema.  Lymphadenopathy:    He has no cervical adenopathy.       Right: No supraclavicular adenopathy present.       Left: No supraclavicular adenopathy present.  Neurological: He is alert and oriented to person, place, and time.  Skin: Skin is warm and dry.  Psychiatric: Mood, memory, affect and judgment normal.  Nursing note and vitals reviewed.    LABORATORY DATA:  CBC    Component Value Date/Time   WBC 6.0 06/02/2016 1334   RBC 2.71 (L) 06/02/2016 1334   HGB 9.4 (L) 06/02/2016 1334   HCT 27.4 (L) 06/02/2016 1334   PLT 120 (L) 06/02/2016 1334   MCV 101.1 (H) 06/02/2016 1334   MCH 34.7 (H) 06/02/2016 1334   MCHC 34.3 06/02/2016 1334   RDW 14.1 06/02/2016 1334   LYMPHSABS 1.9 06/02/2016 1334   MONOABS 0.5 06/02/2016 1334   EOSABS 0.1 06/02/2016 1334   BASOSABS 0.0 06/02/2016 1334   CMP Latest Ref Rng & Units 06/02/2016 03/23/2016 02/14/2016  Glucose 65 - 99 mg/dL 157(H) 100(H) 140(H)  BUN 6 - 20 mg/dL 42(H) 42(H) 41(H)  Creatinine 0.61 - 1.24 mg/dL 2.24(H) 2.19(H) 2.17(H)  Sodium 135 - 145 mmol/L 138 137 137  Potassium 3.5 - 5.1 mmol/L 4.5 4.5 4.3  Chloride 101 - 111 mmol/L 109 110 109  CO2 22 - 32 mmol/L _0 Calcium 8.9 - 10.3 mg/dL 8.8(L) 8.6(L) 8.9  Total Protein 6.5 - 8.1 g/dL 7.4 7.4 -  Total Bilirubin 0.3 - 1.2 mg/dL 0.6 0.4 -  Alkaline Phos 38 - 126 U/L 65 90 -  AST 15 - 41 U/L 17 21 -  ALT 17 - 63 U/L 10(L) 11(L) -    Results for DERK, DOUBEK (MRN [id number])   Ref. Range 06/02/2016 13:34  EGFR (Non-African Amer.) Latest Ref Range: >60 mL/min 25 (L)    PENDING LABS: -SPEP, IFE, Iron studies, beta 2 microglobuline    RADIOGRAPHY: Last CT abd/pelvis: 09/18/15      Myeloma Bone Survey: 03/23/16 .    PATHOLOGY:       ASSESSMENT/PLAN:  Anemia CKD MGUS Low serum B12 Urothelial carcinoma   Anemia, MGUS:  -His anemia is slightly improved today on CBC with hemoglobin 9.4.  His energy levels are low,  but he feels they are low. He remains independent in his  ADLs.  Possible etiology of his anemia include CKD; his creatinine today is elevated at 2.24; EGFR low at 25.  Based on Dr. Donald Pore previous assessment and plan, a bone marrow biopsy was recommended for further work-up and to establish a more definitive diagnosis of MGUS and anemia.  We discussed the purpose of the bone marrow biopsy, as well as the actual procedure.  He was given the option to have the bone marrow bx done here at [facility] with local anesthesia only by Dr. Talbert Cage vs traveling to [facility] for the biopsy to be done with image-guidance and sedation where he would need a driver after the procedure.  We discussed the pros/cons of each option.  After discussion, he prefers to have the bone marrow biopsy done here at the cancer center with Dr. Talbert Cage.  He understands that we will not be able to give him a sedative for the procedure and was cautioned about the possibility of intense pain and pressure.  He continues to select having the procedure done at [facility], which we are happy to accommodate.  I recommended that he come in next week to have the procedure done, but he declined.  He prefers to wait until after his next cystoscopy with Dr. Alyson [name] before he has any other procedures done. I explained the rationale for wanting to get the bone marrow biopsy results sooner rather than later, as there is chance for malignancy, to develop a more definitive treatment plan, but he remains steadfast in his decision to wait.  He agreed to come in the week after his cystoscopy scheduled for 06/17/16. Therefore, we will make scheduling arrangements for the bone marrow biopsy for the following week with Dr. Talbert Cage here at the cancer center.   According to Dr. Donald Pore last note, the following is the rationale for bone marrow biopsy evaluation:  A bone marrow aspiration and biopsy is indicated in all patients with an M protein ?1.5 g/dL, patients with IgA  MGUS of any size, patients with an abnormal serum free light chain ratio (ie, ratio of kappa to lambda free light chains <0.26 or >1.65), and in all patients who have any abnormalities of the complete blood count (CBC), serum creatinine, serum calcium, or radiographic bone survey. This practice is supported by a study of 1271 patients with MGUS or multiple myeloma with minimal bone pain (grade 0/1) whose initial work-up included bone marrow evaluation and skeletal survey (without serum free light chain analysis) with the following results:  ?Among patients with an M-protein ?1.5 g/dL, the percentage of patients with bone marrow plasma cell infiltration >10 percent was 7.3 percent overall, but ranged from 4.7 percent to 20 percent in those with IgG and IgA isotypes, respectively.  ?For those with an IgG M-protein <0.5 g/dL, <1 percent had bone marrow plasma cell infiltration >10 percent by morphology.  ?Similarly, among those with an M protein ?1.5 g/dL, the percentage of patients with bone lesions on skeletal survey was <2 percent, irrespective of isotype.    Urothelial carcinoma:  -According to our records, Mr. Tabet is scheduled for cystoscopy with retrograde pyelogram for stent exchange on 06/18/15.  Will defer to Dr. Alyson [name] for continued management. Patient did report new dysuria and hematuria today; I will check UA with culture to assess for UTI.  Infection is unlikely, but would alleviate some of his symptoms if UA is positive.  Will treat with appropriate antimicrobial therapy, if indicated.  Addendum:  -Received update from nurse that patient was unable  to void before leaving clinic; he was advised to return urine sample to lab when he was able.    Weight loss:  -His weight is largely stable, but he has lost about 4 lbs in the past 3 months.  His appetite is good; encouraged him to continue to eat foods high in protein and calories as tolerated. Recommended he supplement his oral intake  with Boost/Ensure as tolerated to prevent further weight loss.   Shortness of breath with congestion:  -This has been chronic; he reports slight worsening in symptoms over the past year.  He does have asbestos exposure and prior smoking history. His voice is periodically hoarse as a result.  I think this is unrelated to his h/o bladder cancer and anemia/MGUS.  Encouraged him to follow-up with his PCP, as they could possibly recommend inhaled corticosteroids or albuterol, if deemed clinically appropriate.     Dispo:  -Return to cancer center for bone marrow biopsy on 06/23/16.  Dr. Talbert Cage to determine appropriate follow-up schedule after that visit.        ORDERS PLACED FOR THIS ENCOUNTER: Orders Placed This Encounter  Procedures  . CBC with Differential  . Comprehensive metabolic panel  . Iron and TIBC  . Ferritin  . Beta 2 microglobuline, serum  . Protein electrophoresis, serum  . Immunofixation electrophoresis   Orders Placed This Encounter  Procedures  . CBC with Differential    Standing Status:   Future    Number of Occurrences:   1    Standing Expiration Date:   06/02/2017  . Comprehensive metabolic panel    Standing Status:   Future    Number of Occurrences:   1    Standing Expiration Date:   06/02/2017  . Iron and TIBC    Standing Status:   Future    Number of Occurrences:   1    Standing Expiration Date:   06/02/2017  . Ferritin    Standing Status:   Future    Number of Occurrences:   1    Standing Expiration Date:   06/02/2017  . Beta 2 microglobuline, serum    Standing Status:   Future    Number of Occurrences:   1    Standing Expiration Date:   06/02/2017  . Protein electrophoresis, serum    Standing Status:   Future    Number of Occurrences:   1    Standing Expiration Date:   06/02/2017  . Immunofixation electrophoresis    Standing Status:   Future    Number of Occurrences:   1    Standing Expiration Date:   06/02/2017    All questions were answered. The  patient knows to call the clinic with any problems, questions or concerns. We can certainly see the patient much sooner if necessary.  Mike Craze, NP [place] [phone]

## 2016-06-02 NOTE — Patient Instructions (Addendum)
[place] at [facility] Discharge Instructions  RECOMMENDATIONS MADE BY THE CONSULTANT AND ANY TEST RESULTS WILL BE SENT TO YOUR REFERRING PHYSICIAN.  You were seen today by Mike Craze NP. Bone Marrow Biopsy with Dr. Talbert Cage to be scheduled in February.    Thank you for choosing [place] at [facility] to provide your oncology and hematology care.  To afford each patient quality time with our provider, please arrive at least 15 minutes before your scheduled appointment time.    If you have a lab appointment with the [facility] please come in thru the  Main Entrance and check in at the main information desk  You need to re-schedule your appointment should you arrive 10 or more minutes late.  We strive to give you quality time with our providers, and arriving late affects you and other patients whose appointments are after yours.  Also, if you no show three or more times for appointments you may be dismissed from the clinic at the providers discretion.     Again, thank you for choosing [facility].  Our hope is that these requests will decrease the amount of time that you wait before being seen by our physicians.       _____________________________________________________________  Should you have questions after your visit to [facility], please contact our office at [phone] between the hours of 8:30 a.m. and 4:30 p.m.  Voicemails left after 4:30 p.m. will not be returned until the following business day.  For prescription refill requests, have your pharmacy contact our office.       Resources For Cancer Patients and their Caregivers ? American Cancer Society: Can assist with transportation, wigs, general needs, runs Look Good Feel Better.        463-613-5845 ? Cancer Care: Provides financial assistance, online support groups, medication/co-pay assistance.  1-800-813-HOPE 442-487-2900) ? [facility] Assists [place] cancer patients and their families through emotional , educational and financial support.  848-089-6860 ? [place] DSS Where to apply for food stamps, Medicaid and utility assistance. (872) 585-4331 ? RCATS: Transportation to medical appointments. 732-876-7811 ? Social Security Administration: May apply for disability if have a Stage IV cancer. 3464848015 409-585-8479 ? [employer], Disability and Transit Services: Assists with nutrition, care and transit needs. [place] Support Programs: '@10RELATIVEDAYS' @ > Cancer Support Group  2nd Tuesday of the month 1pm-2pm, Journey Room  > Creative Journey  3rd Tuesday of the month 1130am-1pm, Journey Room  > Look Good Feel Better  1st Wednesday of the month 10am-12 noon, Journey Room (Call [place] to register [phone])

## 2016-06-03 ENCOUNTER — Other Ambulatory Visit (HOSPITAL_COMMUNITY): Payer: Self-pay | Admitting: Oncology

## 2016-06-03 LAB — PROTEIN ELECTROPHORESIS, SERUM
A/G Ratio: 1.1 (ref 0.7–1.7)
ALPHA-1-GLOBULIN: 0.3 g/dL (ref 0.0–0.4)
ALPHA-2-GLOBULIN: 0.7 g/dL (ref 0.4–1.0)
Albumin ELP: 3.7 g/dL (ref 2.9–4.4)
Beta Globulin: 0.8 g/dL (ref 0.7–1.3)
GAMMA GLOBULIN: 1.7 g/dL (ref 0.4–1.8)
Globulin, Total: 3.5 g/dL (ref 2.2–3.9)
M-SPIKE, %: 1.2 g/dL — AB
Total Protein ELP: 7.2 g/dL (ref 6.0–8.5)

## 2016-06-03 LAB — BETA 2 MICROGLOBULIN, SERUM: Beta-2 Microglobulin: 4.7 mg/L — ABNORMAL HIGH (ref 0.6–2.4)

## 2016-06-05 LAB — IMMUNOFIXATION ELECTROPHORESIS
IGA: 93 mg/dL (ref 61–437)
IGG (IMMUNOGLOBIN G), SERUM: 1726 mg/dL — AB (ref 700–1600)
IGM, SERUM: 40 mg/dL (ref 15–143)
Total Protein ELP: 7.2 g/dL (ref 6.0–8.5)

## 2016-06-09 ENCOUNTER — Encounter (HOSPITAL_COMMUNITY): Payer: Medicare Other | Attending: Adult Health

## 2016-06-09 ENCOUNTER — Encounter (HOSPITAL_COMMUNITY): Payer: Self-pay

## 2016-06-09 VITALS — BP 122/52 | HR 55 | Temp 97.4°F | Resp 18

## 2016-06-09 DIAGNOSIS — D472 Monoclonal gammopathy: Secondary | ICD-10-CM

## 2016-06-09 DIAGNOSIS — D649 Anemia, unspecified: Secondary | ICD-10-CM

## 2016-06-09 DIAGNOSIS — D62 Acute posthemorrhagic anemia: Secondary | ICD-10-CM

## 2016-06-09 DIAGNOSIS — N184 Chronic kidney disease, stage 4 (severe): Secondary | ICD-10-CM | POA: Diagnosis not present

## 2016-06-09 MED ORDER — SODIUM CHLORIDE 0.9 % IV SOLN
125.0000 mg | Freq: Once | INTRAVENOUS | Status: AC
  Administered 2016-06-09: 125 mg via INTRAVENOUS
  Filled 2016-06-09: qty 10

## 2016-06-09 MED ORDER — SODIUM CHLORIDE 0.9 % IV SOLN
Freq: Once | INTRAVENOUS | Status: AC
  Administered 2016-06-09: 14:00:00 via INTRAVENOUS

## 2016-06-09 NOTE — Patient Instructions (Signed)
[place] at [facility] Discharge Instructions  RECOMMENDATIONS MADE BY THE CONSULTANT AND ANY TEST RESULTS WILL BE SENT TO YOUR REFERRING PHYSICIAN.  You received ferric gluconate today Follow up as scheduled.  Thank you for choosing [place] at [facility] to provide your oncology and hematology care.  To afford each patient quality time with our provider, please arrive at least 15 minutes before your scheduled appointment time.    If you have a lab appointment with the [facility] please come in thru the  Main Entrance and check in at the main information desk  You need to re-schedule your appointment should you arrive 10 or more minutes late.  We strive to give you quality time with our providers, and arriving late affects you and other patients whose appointments are after yours.  Also, if you no show three or more times for appointments you may be dismissed from the clinic at the providers discretion.     Again, thank you for choosing [facility].  Our hope is that these requests will decrease the amount of time that you wait before being seen by our physicians.       _____________________________________________________________  Should you have questions after your visit to [facility], please contact our office at [phone] between the hours of 8:30 a.m. and 4:30 p.m.  Voicemails left after 4:30 p.m. will not be returned until the following business day.  For prescription refill requests, have your pharmacy contact our office.       Resources For Cancer Patients and their Caregivers ? American Cancer Society: Can assist with transportation, wigs, general needs, runs Look Good Feel Better.        715-354-9056 ? Cancer Care: Provides financial assistance, online support groups, medication/co-pay assistance.  1-800-813-HOPE (559) 374-2900) ? [place] Assists [place] cancer  patients and their families through emotional , educational and financial support.  (540) 790-4561 ? [place] DSS Where to apply for food stamps, Medicaid and utility assistance. 915-446-8452 ? RCATS: Transportation to medical appointments. 2072167245 ? Social Security Administration: May apply for disability if have a Stage IV cancer. 506 632 9307 819-258-5858 ? [employer], Disability and Transit Services: Assists with nutrition, care and transit needs. [place] Support Programs: @10RELATIVEDAYS @ > Cancer Support Group  2nd Tuesday of the month 1pm-2pm, Journey Room  > Creative Journey  3rd Tuesday of the month 1130am-1pm, Journey Room  > Look Good Feel Better  1st Wednesday of the month 10am-12 noon, Journey Room (Call [place] to register [phone])

## 2016-06-09 NOTE — Patient Instructions (Signed)
Brian [name]  06/09/2016     @PREFPERIOPPHARMACY @   Your procedure is scheduled on 06/17/2016.  Report to [facility] at 9:15 A.M.  Call this number if you have problems the morning of surgery:  [phone]   Remember:  Do not eat food or drink liquids after midnight.  Take these medicines the morning of surgery with A SIP OF WATER Amlodipine, Pepcid, Tramadol   Do not wear jewelry, make-up or nail polish.  Do not wear lotions, powders, or perfumes, or deoderant.  Do not shave 48 hours prior to surgery.  Men may shave face and neck.  Do not bring valuables to the hospital.  [facility] is not responsible for any belongings or valuables.  Contacts, dentures or bridgework may not be worn into surgery.  Leave your suitcase in the car.  After surgery it may be brought to your room.  For patients admitted to the hospital, discharge time will be determined by your treatment team.  Patients discharged the day of surgery will not be allowed to drive home.    Please read over the following fact sheets that you were given. Surgical Site Infection Prevention and Anesthesia Post-op Instructions     PATIENT INSTRUCTIONS POST-ANESTHESIA  IMMEDIATELY FOLLOWING SURGERY:  Do not drive or operate machinery for the first twenty four hours after surgery.  Do not make any important decisions for twenty four hours after surgery or while taking narcotic pain medications or sedatives.  If you develop intractable nausea and vomiting or a severe headache please notify your doctor immediately.  FOLLOW-UP:  Please make an appointment with your surgeon as instructed. You do not need to follow up with anesthesia unless specifically instructed to do so.  WOUND CARE INSTRUCTIONS (if applicable):  Keep a dry clean dressing on the anesthesia/puncture wound site if there is drainage.  Once the wound has quit draining you may leave it open to air.  Generally you should leave the bandage intact for twenty  four hours unless there is drainage.  If the epidural site drains for more than 36-48 hours please call the anesthesia department.  QUESTIONS?:  Please feel free to call your physician or the hospital operator if you have any questions, and they will be happy to assist you.      Cystoscopy Cystoscopy is a procedure that is used to help diagnose and sometimes treat conditions that affect that lower urinary tract. The lower urinary tract includes the bladder and the tube that drains urine from the bladder out of the body (urethra). Cystoscopy is performed with a thin, tube-shaped instrument with a light and camera at the end (cystoscope). The cystoscope may be hard (rigid) or flexible, depending on the goal of the procedure.The cystoscope is inserted through the urethra, into the bladder. Cystoscopy may be recommended if you have:  Urinary tractinfections that keep coming back (recurring).  Blood in the urine (hematuria).  Loss of bladder control (urinary incontinence) or an overactive bladder.  Unusual cells found in a urine sample.  A blockage in the urethra.  Painful urination.  An abnormality in the bladder found during an intravenous pyelogram (IVP) or CT scan. Cystoscopy may also be done to remove a sample of tissue to be examined under a microscope (biopsy). Tell a health care provider about:  Any allergies you have.  All medicines you are taking, including vitamins, herbs, eye drops, creams, and over-the-counter medicines.  Any problems you or family members have had with anesthetic medicines.  Any blood disorders you have.  Any surgeries you have had.  Any medical conditions you have.  Whether you are pregnant or may be pregnant. What are the risks? Generally, this is a safe procedure. However, problems may occur, including:  Infection.  Bleeding.  Allergic reactions to medicines.  Damage to other structures or organs. What happens before the procedure?  Ask  your health care provider about:  Changing or stopping your regular medicines. This is especially important if you are taking diabetes medicines or blood thinners.  Taking medicines such as aspirin and ibuprofen. These medicines can thin your blood. Do not take these medicines before your procedure if your health care provider instructs you not to.  Follow instructions from your health care provider about eating or drinking restrictions.  You may be given antibiotic medicine to help prevent infection.  You may have an exam or testing, such as X-rays of the bladder, urethra, or kidneys.  You may have urine tests to check for signs of infection.  Plan to have someone take you home after the procedure. What happens during the procedure?  To reduce your risk of infection,your health care team will wash or sanitize their hands.  You will be given one or more of the following:  A medicine to help you relax (sedative).  A medicine to numb the area (local anesthetic).  The area around the opening of your urethra will be cleaned.  The cystoscope will be passed through your urethra into your bladder.  Germ-free (sterile)fluid will flow through the cystoscope to fill your bladder. The fluid will stretch your bladder so that your surgeon can clearly examine your bladder walls.  The cystoscope will be removed and your bladder will be emptied. The procedure may vary among health care providers and hospitals. What happens after the procedure?  You may have some soreness or pain in your abdomen and urethra. Medicines will be available to help you.  You may have some blood in your urine.  Do not drive for 24 hours if you received a sedative. This information is not intended to replace advice given to you by your health care provider. Make sure you discuss any questions you have with your health care provider. Document Released: 04/17/2000 Document Revised: 08/29/2015 Document Reviewed:  03/07/2015 Elsevier Interactive Patient Education  2017 [employer].

## 2016-06-12 ENCOUNTER — Encounter (HOSPITAL_COMMUNITY): Payer: Self-pay

## 2016-06-12 ENCOUNTER — Encounter (HOSPITAL_COMMUNITY)
Admission: RE | Admit: 2016-06-12 | Discharge: 2016-06-12 | Disposition: A | Discharge status: Home / Self Care | Payer: Medicare Other | Source: Ambulatory Visit | Attending: Urology | Admitting: Urology

## 2016-06-12 DIAGNOSIS — Z01818 Encounter for other preprocedural examination: Secondary | ICD-10-CM | POA: Diagnosis not present

## 2016-06-12 HISTORY — DX: Pure hypercholesterolemia, unspecified: E78.00

## 2016-06-12 HISTORY — DX: Personal history of other diseases of the musculoskeletal system and connective tissue: Z87.39

## 2016-06-12 HISTORY — DX: Personal history of urinary calculi: Z87.442

## 2016-06-12 HISTORY — DX: Unspecified osteoarthritis, unspecified site: M19.90

## 2016-06-12 LAB — CBC WITH DIFFERENTIAL/PLATELET
BASOS ABS: 0 10*3/uL (ref 0.0–0.1)
BASOS PCT: 1 %
Eosinophils Absolute: 0.2 10*3/uL (ref 0.0–0.7)
Eosinophils Relative: 3 %
HEMATOCRIT: 26.4 % — AB (ref 39.0–52.0)
Hemoglobin: 8.9 g/dL — ABNORMAL LOW (ref 13.0–17.0)
Lymphocytes Relative: 33 %
Lymphs Abs: 1.7 10*3/uL (ref 0.7–4.0)
MCH: 34 pg (ref 26.0–34.0)
MCHC: 33.7 g/dL (ref 30.0–36.0)
MCV: 100.8 fL — ABNORMAL HIGH (ref 78.0–100.0)
MONO ABS: 0.6 10*3/uL (ref 0.1–1.0)
MONOS PCT: 12 %
NEUTROS ABS: 2.7 10*3/uL (ref 1.7–7.7)
NEUTROS PCT: 51 %
Platelets: 105 10*3/uL — ABNORMAL LOW (ref 150–400)
RBC: 2.62 MIL/uL — ABNORMAL LOW (ref 4.22–5.81)
RDW: 14.2 % (ref 11.5–15.5)
WBC: 5.2 10*3/uL (ref 4.0–10.5)

## 2016-06-12 LAB — BASIC METABOLIC PANEL
ANION GAP: 5 (ref 5–15)
BUN: 41 mg/dL — ABNORMAL HIGH (ref 6–20)
CALCIUM: 8.5 mg/dL — AB (ref 8.9–10.3)
CO2: 24 mmol/L (ref 22–32)
CREATININE: 2.3 mg/dL — AB (ref 0.61–1.24)
Chloride: 110 mmol/L (ref 101–111)
GFR, EST AFRICAN AMERICAN: 28 mL/min — AB (ref >60)
GFR, EST NON AFRICAN AMERICAN: 24 mL/min — AB (ref >60)
Glucose, Bld: 138 mg/dL — ABNORMAL HIGH (ref 65–99)
Potassium: 4.2 mmol/L (ref 3.5–5.1)
SODIUM: 139 mmol/L (ref 135–145)

## 2016-06-12 NOTE — Pre-Procedure Instructions (Signed)
Dr Alyson Ingles aware and will arrange follow up with PCP.  No new orders taken and states that he will proceed with surgery, as planned.

## 2016-06-16 ENCOUNTER — Encounter (HOSPITAL_BASED_OUTPATIENT_CLINIC_OR_DEPARTMENT_OTHER): Payer: Medicare Other

## 2016-06-16 ENCOUNTER — Encounter (HOSPITAL_COMMUNITY): Payer: Self-pay

## 2016-06-16 VITALS — BP 131/56 | HR 62 | Temp 97.8°F | Resp 18

## 2016-06-16 DIAGNOSIS — N184 Chronic kidney disease, stage 4 (severe): Secondary | ICD-10-CM | POA: Diagnosis present

## 2016-06-16 DIAGNOSIS — D631 Anemia in chronic kidney disease: Secondary | ICD-10-CM

## 2016-06-16 DIAGNOSIS — D62 Acute posthemorrhagic anemia: Secondary | ICD-10-CM

## 2016-06-16 MED ORDER — SODIUM CHLORIDE 0.9 % IV SOLN
Freq: Once | INTRAVENOUS | Status: AC
  Administered 2016-06-16: 14:00:00 via INTRAVENOUS

## 2016-06-16 MED ORDER — SODIUM CHLORIDE 0.9 % IV SOLN
125.0000 mg | Freq: Once | INTRAVENOUS | Status: AC
  Administered 2016-06-16: 125 mg via INTRAVENOUS
  Filled 2016-06-16: qty 10

## 2016-06-16 NOTE — Progress Notes (Signed)
Tolerated infusion w/o adverse reaction.  Alert, in no distress.  VSS.  Discharged ambulatory.  

## 2016-06-16 NOTE — Patient Instructions (Signed)
[facility] at [facility] Discharge Instructions  RECOMMENDATIONS MADE BY THE CONSULTANT AND ANY TEST RESULTS WILL BE SENT TO YOUR REFERRING PHYSICIAN.  Iron infusion today. Return as scheduled.   Thank you for choosing [facility] at [facility] to provide your oncology and hematology care.  To afford each patient quality time with our provider, please arrive at least 15 minutes before your scheduled appointment time.    If you have a lab appointment with the Cancer Center please come in thru the  Main Entrance and check in at the main information desk  You need to re-schedule your appointment should you arrive 10 or more minutes late.  We strive to give you quality time with our providers, and arriving late affects you and other patients whose appointments are after yours.  Also, if you no show three or more times for appointments you may be dismissed from the clinic at the providers discretion.     Again, thank you for choosing [facility].  Our hope is that these requests will decrease the amount of time that you wait before being seen by our physicians.       _____________________________________________________________  Should you have questions after your visit to [facility], please contact our office at [phone] between the hours of 8:30 a.m. and 4:30 p.m.  Voicemails left after 4:30 p.m. will not be returned until the following business day.  For prescription refill requests, have your pharmacy contact our office.       Resources For Cancer Patients and their Caregivers ? American Cancer Society: Can assist with transportation, wigs, general needs, runs Look Good Feel Better.        1-888-227-6333 ? Cancer Care: Provides financial assistance, online support groups, medication/co-pay assistance.  1-800-813-HOPE (4673) ? [facility] Assists [place] cancer patients and their  families through emotional , educational and financial support.  336-427-4357 ? [place] DSS Where to apply for food stamps, Medicaid and utility assistance. 336-342-1394 ? [facility]: Transportation to medical appointments. 336-347-2287 ? Social Security Administration: May apply for disability if have a Stage IV cancer. 336-342-7796 1-800-772-1213 ? [place] Aging, Disability and Transit Services: Assists with nutrition, care and transit needs. 336-349-2343  Cancer Center Support Programs: @[email or web address]@ > Cancer Support Group  2nd Tuesday of the month 1pm-2pm, Journey Room  > Creative Journey  3rd Tuesday of the month 1130am-1pm, Journey Room  > Look Good Feel Better  1st Wednesday of the month 10am-12 noon, Journey Room (Call American Cancer Society to register 1-800-395-5775)   

## 2016-06-17 ENCOUNTER — Ambulatory Visit (HOSPITAL_COMMUNITY): Payer: Medicare Other

## 2016-06-17 ENCOUNTER — Ambulatory Visit (HOSPITAL_COMMUNITY)
Admission: RE | Admit: 2016-06-17 | Discharge: 2016-06-17 | Disposition: A | Discharge status: Home / Self Care | Payer: Medicare Other | Source: Ambulatory Visit | Attending: Urology | Admitting: Urology

## 2016-06-17 ENCOUNTER — Ambulatory Visit (HOSPITAL_COMMUNITY): Payer: Medicare Other | Admitting: Anesthesiology

## 2016-06-17 ENCOUNTER — Encounter (HOSPITAL_COMMUNITY)
Admission: RE | Disposition: A | Discharge status: Home / Self Care | Payer: Self-pay | Source: Ambulatory Visit | Attending: Urology

## 2016-06-17 DIAGNOSIS — D4121 Neoplasm of uncertain behavior of right ureter: Secondary | ICD-10-CM | POA: Insufficient documentation

## 2016-06-17 DIAGNOSIS — N184 Chronic kidney disease, stage 4 (severe): Secondary | ICD-10-CM | POA: Diagnosis not present

## 2016-06-17 DIAGNOSIS — N1330 Unspecified hydronephrosis: Secondary | ICD-10-CM | POA: Insufficient documentation

## 2016-06-17 DIAGNOSIS — N132 Hydronephrosis with renal and ureteral calculous obstruction: Secondary | ICD-10-CM | POA: Diagnosis not present

## 2016-06-17 DIAGNOSIS — Z87891 Personal history of nicotine dependence: Secondary | ICD-10-CM | POA: Diagnosis not present

## 2016-06-17 DIAGNOSIS — Z955 Presence of coronary angioplasty implant and graft: Secondary | ICD-10-CM | POA: Diagnosis not present

## 2016-06-17 DIAGNOSIS — E7800 Pure hypercholesterolemia, unspecified: Secondary | ICD-10-CM | POA: Diagnosis not present

## 2016-06-17 DIAGNOSIS — Z79899 Other long term (current) drug therapy: Secondary | ICD-10-CM | POA: Diagnosis not present

## 2016-06-17 DIAGNOSIS — Z8554 Personal history of malignant neoplasm of ureter: Secondary | ICD-10-CM | POA: Diagnosis not present

## 2016-06-17 DIAGNOSIS — Z7982 Long term (current) use of aspirin: Secondary | ICD-10-CM | POA: Diagnosis not present

## 2016-06-17 DIAGNOSIS — Z951 Presence of aortocoronary bypass graft: Secondary | ICD-10-CM | POA: Diagnosis not present

## 2016-06-17 DIAGNOSIS — I2510 Atherosclerotic heart disease of native coronary artery without angina pectoris: Secondary | ICD-10-CM | POA: Diagnosis not present

## 2016-06-17 DIAGNOSIS — R310 Gross hematuria: Secondary | ICD-10-CM | POA: Diagnosis present

## 2016-06-17 DIAGNOSIS — C661 Malignant neoplasm of right ureter: Secondary | ICD-10-CM | POA: Diagnosis not present

## 2016-06-17 DIAGNOSIS — K219 Gastro-esophageal reflux disease without esophagitis: Secondary | ICD-10-CM | POA: Diagnosis not present

## 2016-06-17 DIAGNOSIS — I129 Hypertensive chronic kidney disease with stage 1 through stage 4 chronic kidney disease, or unspecified chronic kidney disease: Secondary | ICD-10-CM | POA: Diagnosis not present

## 2016-06-17 DIAGNOSIS — D499 Neoplasm of unspecified behavior of unspecified site: Secondary | ICD-10-CM

## 2016-06-17 HISTORY — PX: CYSTOSCOPY WITH URETERAL RESECTION: SHX6747

## 2016-06-17 HISTORY — PX: CYSTOSCOPY W/ URETERAL STENT PLACEMENT: SHX1429

## 2016-06-17 SURGERY — CYSTOSCOPY, WITH RETROGRADE PYELOGRAM AND URETERAL STENT INSERTION
Anesthesia: General | Laterality: Right

## 2016-06-17 MED ORDER — LACTATED RINGERS IV SOLN
INTRAVENOUS | Status: DC
  Administered 2016-06-17: 12:00:00 via INTRAVENOUS

## 2016-06-17 MED ORDER — LIDOCAINE HCL (CARDIAC) 10 MG/ML IV SOLN: INTRAVENOUS | Status: DC | PRN

## 2016-06-17 MED ORDER — CEFAZOLIN IN D5W 1 GM/50ML IV SOLN
INTRAVENOUS | Status: AC
  Filled 2016-06-17: qty 50

## 2016-06-17 MED ORDER — EPHEDRINE SULFATE 50 MG/ML IJ SOLN
INTRAMUSCULAR | Status: DC | PRN
  Administered 2016-06-17 (×2): 10 mg via INTRAVENOUS

## 2016-06-17 MED ORDER — STERILE WATER FOR IRRIGATION IR SOLN
Status: DC | PRN
  Administered 2016-06-17: 3000 mL

## 2016-06-17 MED ORDER — FENTANYL CITRATE (PF) 250 MCG/5ML IJ SOLN
INTRAMUSCULAR | Status: AC
  Filled 2016-06-17: qty 5

## 2016-06-17 MED ORDER — DIATRIZOATE MEGLUMINE 30 % UR SOLN
URETHRAL | Status: DC | PRN
  Administered 2016-06-17: 10 mL via URETHRAL

## 2016-06-17 MED ORDER — MIDAZOLAM HCL 2 MG/2ML IJ SOLN: 1.0000 mg | INTRAMUSCULAR | Status: AC

## 2016-06-17 MED ORDER — FENTANYL CITRATE (PF) 100 MCG/2ML IJ SOLN
INTRAMUSCULAR | Status: DC | PRN
  Administered 2016-06-17 (×2): 25 ug via INTRAVENOUS

## 2016-06-17 MED ORDER — LACTATED RINGERS IV SOLN: INTRAVENOUS | Status: DC

## 2016-06-17 MED ORDER — MIDAZOLAM HCL 2 MG/2ML IJ SOLN
1.0000 mg | INTRAMUSCULAR | Status: AC
  Administered 2016-06-17: 2 mg via INTRAVENOUS

## 2016-06-17 MED ORDER — CEFAZOLIN IN D5W 1 GM/50ML IV SOLN
1.0000 g | INTRAVENOUS | Status: AC
  Administered 2016-06-17: 1 g via INTRAVENOUS

## 2016-06-17 MED ORDER — MIDAZOLAM HCL 2 MG/2ML IJ SOLN
INTRAMUSCULAR | Status: AC
  Filled 2016-06-17: qty 2

## 2016-06-17 MED ORDER — PROPOFOL 10 MG/ML IV BOLUS
INTRAVENOUS | Status: DC | PRN
  Administered 2016-06-17: 90 mg via INTRAVENOUS

## 2016-06-17 MED ORDER — FENTANYL CITRATE (PF) 100 MCG/2ML IJ SOLN: 25.0000 ug | INTRAMUSCULAR | Status: DC | PRN

## 2016-06-17 MED ORDER — PROPOFOL 10 MG/ML IV BOLUS
INTRAVENOUS | Status: AC
  Filled 2016-06-17: qty 20

## 2016-06-17 MED ORDER — DIATRIZOATE MEGLUMINE 30 % UR SOLN
URETHRAL | Status: AC
  Filled 2016-06-17: qty 300

## 2016-06-17 MED ORDER — LIDOCAINE HCL (CARDIAC) 10 MG/ML IV SOLN
INTRAVENOUS | Status: DC | PRN
  Administered 2016-06-17: 30 mg via INTRAVENOUS

## 2016-06-17 SURGICAL SUPPLY — 29 items
BAG DRAIN URO TABLE W/ADPT NS (DRAPE) ×3 IMPLANT
BAG DRN 8 ADPR NS SKTRN CSTL (DRAPE) ×1
BAG HAMPER (MISCELLANEOUS) ×3 IMPLANT
CATH INTERMIT  6FR 70CM (CATHETERS) ×3 IMPLANT
CLOTH BEACON ORANGE TIMEOUT ST (SAFETY) ×3 IMPLANT
DECANTER SPIKE VIAL GLASS SM (MISCELLANEOUS) ×3 IMPLANT
EXTRACTOR STONE NITINOL NGAGE (UROLOGICAL SUPPLIES) IMPLANT
FORMALIN 10 PREFIL 120ML (MISCELLANEOUS) ×3 IMPLANT
GLOVE BIO SURGEON STRL SZ8 (GLOVE) ×3 IMPLANT
GLOVE BIOGEL M 8.0 STRL (GLOVE) ×3 IMPLANT
GOWN STRL REUS W/ TWL XL LVL3 (GOWN DISPOSABLE) ×1 IMPLANT
GOWN STRL REUS W/TWL LRG LVL3 (GOWN DISPOSABLE) ×6 IMPLANT
GOWN STRL REUS W/TWL XL LVL3 (GOWN DISPOSABLE) ×3
GUIDEWIRE STR DUAL SENSOR (WIRE) ×3 IMPLANT
GUIDEWIRE STR ZIPWIRE 035X150 (MISCELLANEOUS) ×3 IMPLANT
IV NS IRRIG 3000ML ARTHROMATIC (IV SOLUTION) ×3 IMPLANT
KIT ROOM TURNOVER AP CYSTO (KITS) ×3 IMPLANT
MANIFOLD NEPTUNE II (INSTRUMENTS) ×3 IMPLANT
NDL HYPO 18GX1.5 BLUNT FILL (NEEDLE) ×1 IMPLANT
NEEDLE HYPO 18GX1.5 BLUNT FILL (NEEDLE) ×3 IMPLANT
PACK CYSTO (CUSTOM PROCEDURE TRAY) ×3 IMPLANT
PAD ARMBOARD 7.5X6 YLW CONV (MISCELLANEOUS) ×3 IMPLANT
PAD TELFA 3X4 1S STER (GAUZE/BANDAGES/DRESSINGS) ×3 IMPLANT
STENT URET 6FRX26 CONTOUR (STENTS) ×2 IMPLANT
SYRINGE 10CC LL (SYRINGE) ×3 IMPLANT
TAPE CLOTH SILK CARING 2INX10 (GAUZE/BANDAGES/DRESSINGS) ×2 IMPLANT
TOWEL OR 17X26 4PK STRL BLUE (TOWEL DISPOSABLE) ×3 IMPLANT
WATER STERILE IRR 1000ML POUR (IV SOLUTION) ×3 IMPLANT
WATER STERILE IRR 3000ML UROMA (IV SOLUTION) ×6 IMPLANT

## 2016-06-17 NOTE — Anesthesia Postprocedure Evaluation (Signed)
Anesthesia Post Note  Patient: Brian [name]  Procedure(s) Performed: Procedure(s) (LRB): CYSTOSCOPY WITH RETROGRADE PYELOGRAM/STENT EXCHANGE (Right) URETERAL TUMOR ABLATION (Right)  Patient location during evaluation: PACU Anesthesia Type: General Level of consciousness: awake and alert and oriented Pain management: pain level controlled Vital Signs Assessment: post-procedure vital signs reviewed and stable Respiratory status: spontaneous breathing Cardiovascular status: blood pressure returned to baseline Postop Assessment: no signs of nausea or vomiting Anesthetic complications: no     Last Vitals:  Vitals:   06/17/16 1155 06/17/16 1247  BP: 113/60 (!) 107/51  Pulse:  69  Resp: 12   Temp:  36.4 C    Last Pain: There were no vitals filed for this visit.               Jeana Kersting

## 2016-06-17 NOTE — Transfer of Care (Signed)
Immediate Anesthesia Transfer of Care Note  Patient: Brian [name]  Procedure(s) Performed: Procedure(s) with comments: CYSTOSCOPY WITH RETROGRADE PYELOGRAM/STENT EXCHANGE (Right) - pt knows to arrive at 11:30 [place] (Right)  Patient Location: PACU  Anesthesia Type:General  Level of Consciousness: awake and alert   Airway & Oxygen Therapy: Patient Spontanous Breathing  Post-op Assessment: Report given to RN  Post vital signs: Reviewed and stable  Last Vitals:  Vitals:   06/17/16 1150 06/17/16 1155  BP:  113/60  Pulse:    Resp: 13 12  Temp:      Last Pain: There were no vitals filed for this visit.    Patients Stated Pain Goal: 9 ([id number])  Complications: No apparent anesthesia complications

## 2016-06-17 NOTE — H&P (Signed)
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 He is here for surveillance ureterscopy  Past Medical History:  Diagnosis Date  . Aortic stenosis 3/11   Very mild, possibly bicuspid valve  . Arthritis   . Chronic renal disease, stage 4, severely decreased glomerular filtration rate (GFR) between 15-29 mL/min/1.73 square meter (HCC) 03/23/2016  . Coronary atherosclerosis of native coronary artery 5/11   Multivessel, DES circumflex 5/11, 50% LAD, occluded RCA, LVEF 60%  . Essential hypertension, benign   . GERD (gastroesophageal reflux disease)   . History of gout   . History of kidney stones   . Hypercholesteremia   . Proteinuria 03/23/2016   Past Surgical History:  Procedure Laterality Date  . BIOPSY N/A 01/15/2016   Procedure: BLADDER BIOPSY WITH FULGERATION;  Surgeon: Cleon Gustin, MD;  Location: [facility];  Service: Urology;  Laterality: N/A;  . CATARACT EXTRACTION     BILATERAL  . CORONARY ANGIOPLASTY     stent   4 years ago  . CYSTOSCOPY W/ RETROGRADES Right 01/15/2016   Procedure: CYSTOSCOPY WITH RIGHT RETROGRADE PYELOGRAM;  Surgeon: Cleon Gustin, MD;  Location: [facility];  Service: Urology;  Laterality: Right;  . CYSTOSCOPY W/ RETROGRADES Right 02/19/2016   Procedure: CYSTOSCOPY WITH RIGHT RETROGRADE PYELOGRAM;  Surgeon: Cleon Gustin, MD;  Location: [facility];  Service: Urology;  Laterality: Right;  . CYSTOSCOPY WITH STENT PLACEMENT Right 01/15/2016   Procedure: CYSTOSCOPY  WITH RIGHT STENT PLACEMENT;  Surgeon: Cleon Gustin, MD;  Location: [facility];  Service: Urology;  Laterality: Right;  . CYSTOSCOPY WITH STENT PLACEMENT Right 02/19/2016   Procedure: CYSTOSCOPY WITH RIGHT URETERA; STENT EXCHANGE;  Surgeon: Cleon Gustin, MD;  Location: [facility];  Service: Urology;  Laterality: Right;  . URETEROSCOPY Right 01/15/2016   Procedure: RIGHT URETEROSCOPY WITH BIOPSY;  Surgeon: Cleon Gustin, MD;  Location: [facility];  Service: Urology;  Laterality: Right;    Home Medications:  Prescriptions Prior to Admission  Medication Sig Dispense Refill Last Dose  . amLODipine (NORVASC) 10 MG tablet Take 10 mg by mouth daily.   06/17/2016 at 0800  . aspirin 81 MG tablet Take 81 mg by mouth daily.     06/12/2016 at Unknown time  . atorvastatin (LIPITOR) 40 MG tablet Take 1 tablet (40 mg total) by mouth daily. 90 tablet 3 06/16/2016 at Unknown time  . famotidine (PEPCID) 20 MG tablet Take 20 mg by mouth daily as needed for heartburn or indigestion.    06/15/2016 at Unknown time  . nitroGLYCERIN (NITROSTAT) 0.4 MG SL tablet Place 0.4 mg under the tongue every 5 (five) minutes as needed. TAKE AS DIRECTED FOR CHEST PAIN, NOT TO EXCEED 3 DOSES.    Taking  . valsartan (DIOVAN) 320 MG tablet Take 320 mg by mouth daily.   06/16/2016 at Unknown time  . vitamin B-12 (CYANOCOBALAMIN) 1000 MCG tablet Take 1,000 mcg by mouth daily.   06/16/2016 at Unknown time  . traMADol (ULTRAM) 50 MG tablet Take 1 tablet (50 mg total) by mouth every 6 (six) hours as needed. 30 tablet 0 More than a month at Unknown time   Allergies:  Allergies  Allergen Reactions  . Codeine     REACTION: nausea    No family history on file. Social History:  reports that he quit smoking about 27 years ago. His smoking  use included Cigarettes. He has a 80.00 pack-year smoking history. He has never used smokeless tobacco. He reports that he does not drink alcohol or use drugs.  Review of Systems  All other systems reviewed and are negative.   Physical Exam:  Vital signs in last 24 hours: Temp:  [97.6 F (36.4 C)-97.8 F (36.6 C)] 97.6 F (36.4 C) (02/14 1137) Pulse Rate:  [62-65] 62 (02/14 1137) Resp:  [16-18] 16 (02/14 1137) BP: (131-148)/(47-56) 133/52 (02/14 1137) SpO2:  [98 %-99 %] 99 % (02/14 1137) Physical Exam  Constitutional: He is oriented to person,  place, and time. He appears well-developed and well-nourished.  HENT:  Head: Normocephalic and atraumatic.  Eyes: EOM are normal. Pupils are equal, round, and reactive to light.  Neck: Normal range of motion. No thyromegaly present.  Cardiovascular: Normal rate and regular rhythm.   Respiratory: Effort normal. No respiratory distress.  GI: Soft. He exhibits no distension.  Musculoskeletal: Normal range of motion. He exhibits no edema.  Neurological: He is alert and oriented to person, place, and time.  Skin: Skin is warm and dry.  Psychiatric: He has a normal mood and affect. His behavior is normal. Judgment and thought content normal.    Laboratory Data:  No results found for this or any previous visit (from the past 24 hour(s)). No results found for this or any previous visit (from the past 240 hour(s)). Creatinine:  Recent Labs  06/12/16 1019  CREATININE 2.30*   Baseline Creatinine: 2  Impression/Assessment:  83yo with high grade ureteral tumor  Plan:  The risks/benefits/alternatives t ureteral tumor ablation was explained to the patient and he understands and wishes to proceed with surgery  Nicolette Bang 06/17/2016, 11:51 AM

## 2016-06-17 NOTE — Anesthesia Preprocedure Evaluation (Addendum)
Anesthesia Evaluation  Patient identified by MRN, date of birth, ID band Patient awake    Reviewed: Allergy & Precautions, NPO status , Patient's Chart, lab work & pertinent test results  Airway Mallampati: I  TM Distance: >3 FB     Dental  (+) Edentulous Lower, Edentulous Upper   Pulmonary former smoker,    breath sounds clear to auscultation       Cardiovascular hypertension, Pt. on medications (-) angina+ CAD, + Cardiac Stents and + CABG  + Valvular Problems/Murmurs AS  Rhythm:Regular Rate:Normal     Neuro/Psych    GI/Hepatic negative GI ROS, GERD  Controlled and Medicated,  Endo/Other    Renal/GU Renal InsufficiencyRenal disease     Musculoskeletal   Abdominal   Peds  Hematology  (+) anemia ,   Anesthesia Other Findings   Reproductive/Obstetrics                            Anesthesia Physical Anesthesia Plan  ASA: III  Anesthesia Plan: General   Post-op Pain Management:    Induction: Intravenous  Airway Management Planned: LMA  Additional Equipment:   Intra-op Plan:   Post-operative Plan:   Informed Consent: I have reviewed the patients History and Physical, chart, labs and discussed the procedure including the risks, benefits and alternatives for the proposed anesthesia with the patient or authorized representative who has indicated his/her understanding and acceptance.     Plan Discussed with:   Anesthesia Plan Comments:        Anesthesia Quick Evaluation

## 2016-06-17 NOTE — Anesthesia Procedure Notes (Signed)
Procedure Name: LMA Insertion Date/Time: 06/17/2016 12:08 PM Performed by: Tressie Stalker E Pre-anesthesia Checklist: Patient identified, Patient being monitored, Emergency Drugs available, Timeout performed and Suction available Patient Re-evaluated:Patient Re-evaluated prior to inductionOxygen Delivery Method: Circle System Utilized Preoxygenation: Pre-oxygenation with 100% oxygen Intubation Type: IV induction Ventilation: Mask ventilation without difficulty LMA: LMA inserted LMA Size: 4.0 and 5.0 Number of attempts: 1 Placement Confirmation: positive ETCO2 and breath sounds checked- equal and bilateral

## 2016-06-17 NOTE — Discharge Instructions (Signed)
Ureteral Stent Implantation, Care After °Introduction °Refer to this sheet in the next few weeks. These instructions provide you with information about caring for yourself after your procedure. Your health care provider may also give you more specific instructions. Your treatment has been planned according to current medical practices, but problems sometimes occur. Call your health care provider if you have any problems or questions after your procedure. °What can I expect after the procedure? °After the procedure, it is common to have: °· Nausea. °· Mild pain when you urinate. You may feel this pain in your lower back or lower abdomen. Pain should stop within a few minutes after you urinate. This may last for up to 1 week. °· A small amount of blood in your urine for several days. °Follow these instructions at home: °  °Medicines °· Take over-the-counter and prescription medicines only as told by your health care provider. °· If you were prescribed an antibiotic medicine, take it as told by your health care provider. Do not stop taking the antibiotic even if you start to feel better. °· Do not drive for 24 hours if you received a sedative. °· Do not drive or operate heavy machinery while taking prescription pain medicines. °Activity °· Return to your normal activities as told by your health care provider. Ask your health care provider what activities are safe for you. °· Do not lift anything that is heavier than 10 lb (4.5 kg). Follow this limit for 1 week after your procedure, or for as long as told by your health care provider. °General instructions °· Watch for any blood in your urine. Call your health care provider if the amount of blood in your urine increases. °· If you have a catheter: °¨ Follow instructions from your health care provider about taking care of your catheter and collection bag. °¨ Do not take baths, swim, or use a hot tub until your health care provider approves. °· Drink enough fluid to keep  your urine clear or pale yellow. °· Keep all follow-up visits as told by your health care provider. This is important. °Contact a health care provider if: °· You have pain that gets worse or does not get better with medicine, especially pain when you urinate. °· You have difficulty urinating. °· You feel nauseous or you vomit repeatedly during a period of more than 2 days after the procedure. °Get help right away if: °· Your urine is dark red or has blood clots in it. °· You are leaking urine (have incontinence). °· The end of the stent comes out of your urethra. °· You cannot urinate. °· You have sudden, sharp, or severe pain in your abdomen or lower back. °· You have a fever. °This information is not intended to replace advice given to you by your health care provider. Make sure you discuss any questions you have with your health care provider. °Document Released: 12/21/2012 Document Revised: 09/26/2015 Document Reviewed: 11/02/2014 °© 2017 Elsevier ° °

## 2016-06-17 NOTE — Brief Op Note (Signed)
06/17/2016  12:39 PM  PATIENT:  Brian [name]  83 y.o. male  PRE-OPERATIVE DIAGNOSIS:  right ureteral cancer  POST-OPERATIVE DIAGNOSIS:  right ureteral cancer  PROCEDURE:  Procedure(s) with comments: CYSTOSCOPY WITH RETROGRADE PYELOGRAM/STENT EXCHANGE (Right) - pt knows to arrive at 11:30 [place] (Right)  SURGEON:  Surgeon(s) and Role:    * Cleon Gustin, MD - Primary  PHYSICIAN ASSISTANT:   ASSISTANTS: none   ANESTHESIA:   general  EBL:  Total I/O In: 500 [I.V.:500] Out: 0   BLOOD ADMINISTERED:none  DRAINS: right 6x26 JJ ureteral stent   LOCAL MEDICATIONS USED:  NONE  SPECIMEN:  No Specimen  DISPOSITION OF SPECIMEN:  N/A  COUNTS:  YES  TOURNIQUET:  * No tourniquets in log *  DICTATION: .Note written in EPIC  PLAN OF CARE: Discharge to home after PACU  PATIENT DISPOSITION:  PACU - hemodynamically stable.   Delay start of Pharmacological VTE agent (>24hrs) due to surgical blood loss or risk of bleeding: not applicable

## 2016-06-18 ENCOUNTER — Encounter (HOSPITAL_COMMUNITY): Payer: Self-pay | Admitting: Urology

## 2016-06-23 ENCOUNTER — Encounter (HOSPITAL_COMMUNITY): Payer: Medicare Other | Attending: Oncology | Admitting: Oncology

## 2016-06-23 ENCOUNTER — Encounter (HOSPITAL_COMMUNITY): Payer: Self-pay

## 2016-06-23 VITALS — BP 145/54 | HR 70 | Temp 98.0°F | Resp 18 | Wt 161.2 lb

## 2016-06-23 DIAGNOSIS — D61818 Other pancytopenia: Secondary | ICD-10-CM | POA: Diagnosis not present

## 2016-06-23 DIAGNOSIS — D649 Anemia, unspecified: Secondary | ICD-10-CM | POA: Diagnosis not present

## 2016-06-23 DIAGNOSIS — D72822 Plasmacytosis: Secondary | ICD-10-CM | POA: Diagnosis not present

## 2016-06-23 DIAGNOSIS — D539 Nutritional anemia, unspecified: Secondary | ICD-10-CM | POA: Diagnosis not present

## 2016-06-23 DIAGNOSIS — D472 Monoclonal gammopathy: Secondary | ICD-10-CM | POA: Diagnosis present

## 2016-06-23 LAB — CBC WITH DIFFERENTIAL/PLATELET
Basophils Absolute: 0 10*3/uL (ref 0.0–0.1)
Basophils Relative: 1 %
EOS PCT: 4 %
Eosinophils Absolute: 0.2 10*3/uL (ref 0.0–0.7)
HCT: 26.2 % — ABNORMAL LOW (ref 39.0–52.0)
Hemoglobin: 8.9 g/dL — ABNORMAL LOW (ref 13.0–17.0)
LYMPHS ABS: 1.2 10*3/uL (ref 0.7–4.0)
LYMPHS PCT: 29 %
MCH: 34.4 pg — ABNORMAL HIGH (ref 26.0–34.0)
MCHC: 34 g/dL (ref 30.0–36.0)
MCV: 101.2 fL — AB (ref 78.0–100.0)
MONO ABS: 0.5 10*3/uL (ref 0.1–1.0)
MONOS PCT: 11 %
Neutro Abs: 2.3 10*3/uL (ref 1.7–7.7)
Neutrophils Relative %: 55 %
PLATELETS: 107 10*3/uL — AB (ref 150–400)
RBC: 2.59 MIL/uL — AB (ref 4.22–5.81)
RDW: 14.1 % (ref 11.5–15.5)
WBC: 4.2 10*3/uL (ref 4.0–10.5)

## 2016-06-23 LAB — BONE MARROW EXAM

## 2016-06-23 NOTE — Patient Instructions (Signed)
[place] at [facility] Discharge Instructions  RECOMMENDATIONS MADE BY THE CONSULTANT AND ANY TEST RESULTS WILL BE SENT TO YOUR REFERRING PHYSICIAN.  You were seen today by Dr. Barron Schmid You had your bone marrow biopsy performed today at the clinic We will call you within a week with the results Follow up  See Amy up front for appointments   Thank you for choosing [place] at [facility] to provide your oncology and hematology care.  To afford each patient quality time with our provider, please arrive at least 15 minutes before your scheduled appointment time.    If you have a lab appointment with the [facility] please come in thru the  Main Entrance and check in at the main information desk  You need to re-schedule your appointment should you arrive 10 or more minutes late.  We strive to give you quality time with our providers, and arriving late affects you and other patients whose appointments are after yours.  Also, if you no show three or more times for appointments you may be dismissed from the clinic at the providers discretion.     Again, thank you for choosing [facility].  Our hope is that these requests will decrease the amount of time that you wait before being seen by our physicians.       _____________________________________________________________  Should you have questions after your visit to [facility], please contact our office at [phone] between the hours of 8:30 a.m. and 4:30 p.m.  Voicemails left after 4:30 p.m. will not be returned until the following business day.  For prescription refill requests, have your pharmacy contact our office.       Resources For Cancer Patients and their Caregivers ? American Cancer Society: Can assist with transportation, wigs, general needs, runs Look Good Feel Better.        2010228507 ? Cancer Care: Provides financial assistance, online  support groups, medication/co-pay assistance.  1-800-813-HOPE 250-515-5753) ? [place] Assists [place] cancer patients and their families through emotional , educational and financial support.  6670888172 ? [place] DSS Where to apply for food stamps, Medicaid and utility assistance. ([phone] ? RCATS: Transportation to medical appointments. 651-221-2375 ? Social Security Administration: May apply for disability if have a Stage IV cancer. 4236135030 [phone] ? [employer], Disability and Transit Services: Assists with nutrition, care and transit needs. [place] Support Programs: _0 @ > Cancer Support Group  2nd Tuesday of the month 1pm-2pm, Journey Room  > Creative Journey  3rd Tuesday of the month 1130am-1pm, Journey Room  > Look Good Feel Better  1st Wednesday of the month 10am-12 noon, Journey Room (Call [place] to register [phone])

## 2016-06-23 NOTE — Progress Notes (Signed)
Consent signed by patient and provider.  Patient positioned prone on exam table, pillow to support arms and pillow to support lower legs.  Prior to beginning procedure, Time out performed with correct patient identification, correct position, correct site and correct procedure. Provider, patient and nurse agree.  Procedure began at 0846 and ended at 0901. End of procedure patient turned to supine position with pillow under head.

## 2016-06-23 NOTE — Progress Notes (Signed)
Bone Marrow Biopsy and Aspiration Procedure Note   Informed consent was obtained and potential risks including bleeding, infection and pain were reviewed with the patient.   Right Posterior iliac crest(s) prepped with Betadine.   Lidocaine 2% local anesthesia infiltrated into the subcutaneous tissue.  Right bone marrow biopsy and right bone marrow aspirate was obtained.   The procedure was tolerated well and there were no complications.  Specimens sent for: routine histopathologic stains and sectioning, flow cytometry and cytogenetics  Physician: Twana First

## 2016-06-28 NOTE — Op Note (Signed)
Preoperative diagnosis: Right ureteral TCC  Postoperative diagnosis: Same  Procedure: 1 cystoscopy 2.  right retrograde pyelography 3.  Intraoperative fluoroscopy, under one hour, with interpretation 4.  Right diagnostic ureteroscopy 5. Right 6x26 JJ ureteral stent exchange  Attending: Rosie Fate  Anesthesia: General  Estimated blood loss: None  Drains: right 6x26 JJ ureteral stent  Specimens: none  Antibiotics: ancef  Findings: papillary tumor involving the entire ureter from the UVJ to the pelvic brim. Severe hydroureteronephrosis..  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 .  After discussing treatment options, he decided proceed with right diagnostic ureteroscopy  Procedure her in detail: The patient was brought to the operating room and a brief timeout was done to ensure correct patient, correct procedure, correct site.  General anesthesia was administered patient was placed in dorsal lithotomy position.  Her genitalia was then prepped and draped in usual sterile fashion.  A rigid 7 French cystoscope was passed in the urethra and the bladder.  Bladder was inspected free masses or lesions.  the ureteral orifices were in the normal orthotopic locations. Using a grasper the right ureteral stent was brought to the urethral meatus. A zipewire was advanced through the stent and up to the renal pelvis. The stent was then removed. a 6 french ureteral catheter was then instilled into the right ureter orifice.  a gentle retrograde was obtained and findings noted above.   we then removed the cystoscope and cannulated the right ureteral orifice with a semirigid ureteroscope.  we then performed ureteroscopy up to the level of the UPJ. We noted papillary tumor involving the entire pelvic ureter.  We then removed the ureteroscope and placed a 6x26 JJ ureteral stent over the  original zipwire. We removed the wire and good coil was noted in the renal pelvis under fluoroscopy and the bladder under direct vision. the bladder was then drained and this concluded the procedure which was well tolerated by patient.  Complications: None  Condition: Stable, extubated, transferred to PACU  Plan: Pt is to followup in 2 weeks

## 2016-07-01 ENCOUNTER — Ambulatory Visit (INDEPENDENT_AMBULATORY_CARE_PROVIDER_SITE_OTHER): Payer: Medicare Other | Admitting: Urology

## 2016-07-01 DIAGNOSIS — C669 Malignant neoplasm of unspecified ureter: Secondary | ICD-10-CM | POA: Diagnosis not present

## 2016-07-03 LAB — CHROMOSOME ANALYSIS, BONE MARROW

## 2016-07-09 ENCOUNTER — Encounter (HOSPITAL_COMMUNITY): Payer: Self-pay

## 2016-07-15 ENCOUNTER — Other Ambulatory Visit (HOSPITAL_COMMUNITY): Payer: Self-pay | Admitting: Emergency Medicine

## 2016-07-15 ENCOUNTER — Encounter (HOSPITAL_COMMUNITY): Payer: Medicare Other | Attending: Oncology | Admitting: Oncology

## 2016-07-15 ENCOUNTER — Encounter (HOSPITAL_COMMUNITY): Payer: Self-pay

## 2016-07-15 VITALS — Temp 97.7°F | Resp 18

## 2016-07-15 DIAGNOSIS — R809 Proteinuria, unspecified: Secondary | ICD-10-CM | POA: Insufficient documentation

## 2016-07-15 DIAGNOSIS — N189 Chronic kidney disease, unspecified: Secondary | ICD-10-CM | POA: Diagnosis not present

## 2016-07-15 DIAGNOSIS — D649 Anemia, unspecified: Secondary | ICD-10-CM

## 2016-07-15 DIAGNOSIS — E538 Deficiency of other specified B group vitamins: Secondary | ICD-10-CM | POA: Diagnosis not present

## 2016-07-15 DIAGNOSIS — C9000 Multiple myeloma not having achieved remission: Secondary | ICD-10-CM | POA: Diagnosis not present

## 2016-07-15 MED ORDER — LIDOCAINE-PRILOCAINE 2.5-2.5 % EX CREA: TOPICAL_CREAM | CUTANEOUS | 3 refills | Status: DC

## 2016-07-15 MED ORDER — LENALIDOMIDE 25 MG PO CAPS: ORAL_CAPSULE | ORAL | 3 refills | Status: DC

## 2016-07-15 MED ORDER — DEXAMETHASONE 4 MG PO TABS: ORAL_TABLET | ORAL | 3 refills | Status: DC

## 2016-07-15 MED ORDER — ONDANSETRON HCL 8 MG PO TABS: 8.0000 mg | ORAL_TABLET | Freq: Two times a day (BID) | ORAL | 1 refills | Status: DC | PRN

## 2016-07-15 MED ORDER — PROCHLORPERAZINE MALEATE 10 MG PO TABS: 10.0000 mg | ORAL_TABLET | Freq: Four times a day (QID) | ORAL | 1 refills | Status: DC | PRN

## 2016-07-15 MED ORDER — ACYCLOVIR 400 MG PO TABS: 400.0000 mg | ORAL_TABLET | Freq: Two times a day (BID) | ORAL | 3 refills | Status: DC

## 2016-07-15 NOTE — Progress Notes (Signed)
[facility] Hematology/Oncology Progress Note  Name: Brian [name]      MRN: [id number]   Date: 07/15/2016 Time:10:22 AM   REFERRING PHYSICIAN:  Fran Lowes, MD (Nephrology)  REASON FOR CONSULT:  "M-Spike on 24 Hour urine for protein-immunoelectrophoresis"   DIAGNOSIS:  M-spike in urine  HISTORY OF PRESENT ILLNESS:   SAVEON PLANT is a 83 y.o. male with a medical history significant for aortic stenosis, CAD, HTN, hyperlipidemia, GERD, invasive urothelial cancer having undergone a transurethral resection of tumor on 02/19/2016 by Dr. Alyson Ingles (urology) who returns for follow up of a detected M-spike in his 24 hour urine collection.  He has been doing well since his bone marrow biopsy. He is very active at home. Denies chest pain, SOB, abdominal pain, loss of appetite, weight loss, or any other concerns.   Review of Systems  Constitutional: Negative.  Negative for weight loss.       No loss of appetite  HENT: Negative.   Eyes: Negative.   Respiratory: Negative.  Negative for shortness of breath.   Cardiovascular: Negative.  Negative for chest pain.  Gastrointestinal: Negative.  Negative for abdominal pain.  Genitourinary: Negative.   Musculoskeletal: Negative.   Skin: Negative.   Neurological: Negative.   Endo/Heme/Allergies: Negative.   Psychiatric/Behavioral: Negative.   All other systems reviewed and are negative. 14 point review of systems was performed and is negative except as detailed under history of present illness and above  PAST MEDICAL HISTORY:   Past Medical History:  Diagnosis Date  . Aortic stenosis 3/11   Very mild, possibly bicuspid valve  . Arthritis   . Chronic renal disease, stage 4, severely decreased glomerular filtration rate (GFR) between 15-29 mL/min/1.73 square meter (HCC) 03/23/2016  . Coronary atherosclerosis of native coronary artery 5/11   Multivessel, DES circumflex 5/11, 50% LAD, occluded RCA, LVEF 60%  .  Essential hypertension, benign   . GERD (gastroesophageal reflux disease)   . History of gout   . History of kidney stones   . Hypercholesteremia   . Proteinuria 03/23/2016    ALLERGIES: Allergies  Allergen Reactions  . Codeine     REACTION: nausea      MEDICATIONS: I have reviewed the patient's current medications.    Current Outpatient Prescriptions on File Prior to Visit  Medication Sig Dispense Refill  . amLODipine (NORVASC) 10 MG tablet Take 10 mg by mouth daily.    Marland Kitchen aspirin 81 MG tablet Take 81 mg by mouth daily.      Marland Kitchen atorvastatin (LIPITOR) 40 MG tablet Take 1 tablet (40 mg total) by mouth daily. 90 tablet 3  . famotidine (PEPCID) 20 MG tablet Take 20 mg by mouth daily as needed for heartburn or indigestion.     . nitroGLYCERIN (NITROSTAT) 0.4 MG SL tablet Place 0.4 mg under the tongue every 5 (five) minutes as needed. TAKE AS DIRECTED FOR CHEST PAIN, NOT TO EXCEED 3 DOSES.     Marland Kitchen traMADol (ULTRAM) 50 MG tablet Take 1 tablet (50 mg total) by mouth every 6 (six) hours as needed. 30 tablet 0  . valsartan (DIOVAN) 320 MG tablet Take 320 mg by mouth daily.    . vitamin B-12 (CYANOCOBALAMIN) 1000 MCG tablet Take 1,000 mcg by mouth daily.     No current facility-administered medications on file prior to visit.      PAST SURGICAL HISTORY Past Surgical History:  Procedure Laterality Date  . BIOPSY N/A 01/15/2016  Procedure: BLADDER BIOPSY WITH FULGERATION;  Surgeon: Cleon Gustin, MD;  Location: [facility];  Service: Urology;  Laterality: N/A;  . CATARACT EXTRACTION     BILATERAL  . CORONARY ANGIOPLASTY     stent   4 years ago  . CYSTOSCOPY W/ RETROGRADES Right 01/15/2016   Procedure: CYSTOSCOPY WITH RIGHT RETROGRADE PYELOGRAM;  Surgeon: Cleon Gustin, MD;  Location: [facility];  Service: Urology;  Laterality: Right;  . CYSTOSCOPY W/ RETROGRADES Right 02/19/2016   Procedure: CYSTOSCOPY WITH RIGHT RETROGRADE PYELOGRAM;  Surgeon: Cleon Gustin, MD;  Location: [facility];   Service: Urology;  Laterality: Right;  . CYSTOSCOPY W/ URETERAL STENT PLACEMENT Right 06/17/2016   Procedure: CYSTOSCOPY WITH RETROGRADE PYELOGRAM/STENT EXCHANGE;  Surgeon: Cleon Gustin, MD;  Location: [facility];  Service: Urology;  Laterality: Right;  pt knows to arrive at 11:30  . CYSTOSCOPY WITH STENT PLACEMENT Right 01/15/2016   Procedure: CYSTOSCOPY  WITH RIGHT STENT PLACEMENT;  Surgeon: Cleon Gustin, MD;  Location: [facility];  Service: Urology;  Laterality: Right;  . CYSTOSCOPY WITH STENT PLACEMENT Right 02/19/2016   Procedure: CYSTOSCOPY WITH RIGHT URETERA; STENT EXCHANGE;  Surgeon: Cleon Gustin, MD;  Location: [facility];  Service: Urology;  Laterality: Right;  . CYSTOSCOPY WITH URETERAL RESECTION Right 06/17/2016   Procedure: URETERAL TUMOR ABLATION;  Surgeon: Cleon Gustin, MD;  Location: [facility];  Service: Urology;  Laterality: Right;  . URETEROSCOPY Right 01/15/2016   Procedure: RIGHT URETEROSCOPY WITH BIOPSY;  Surgeon: Cleon Gustin, MD;  Location: [facility];  Service: Urology;  Laterality: Right;    FAMILY HISTORY: No family history on file.  He has 5 children, 1 daughter deceased from MI at the age of 57. He has 1 son who had part or all of his kidney removed for a malignancy.  SOCIAL HISTORY:  reports that he quit smoking about 28 years ago. His smoking use included Cigarettes. He has a 80.00 pack-year smoking history. He has never used smokeless tobacco. He reports that he does not drink alcohol or use drugs.  He is divorced, but remarried x 48 years.  His wife's age is 31 and in good health. He is a Engineer, manufacturing.  He is retired from [employer].  Social History   Social History  . Marital status: Married    Spouse name: N/A  . Number of children: N/A  . Years of education: N/A   Occupational History  . Retired Retired    [employer] and [place]  . Smoking status: Former Smoker    Packs/day: 2.00    Years: 40.00    Types: Cigarettes     Quit date: 07/01/1988  . Smokeless tobacco: Never Used  . Alcohol use No  . Drug use: No  . Sexual activity: No   Other Topics Concern  . Not on file   Social History Narrative  . No narrative on file   PERFORMANCE STATUS: The patient's performance status is 1 - Symptomatic but completely ambulatory  PHYSICAL EXAM: Most Recent Vital Signs: Temperature 97.7 F (36.5 C), temperature source Oral, resp. rate 18, SpO2 99 %.  Physical Exam  Constitutional: He is oriented to person, place, and time and well-developed, well-nourished, and in no distress.  HENT:  Head: Normocephalic and atraumatic.  Mouth/Throat: Oropharynx is clear and moist.  Eyes: Conjunctivae and EOM are normal. Pupils are equal, round, and reactive to light.  Neck: Normal range of motion. Neck supple.  Cardiovascular: Normal  rate, regular rhythm and normal heart sounds.   Pulmonary/Chest: Effort normal and breath sounds normal.  Abdominal: Soft. Bowel sounds are normal. There is no tenderness.  Musculoskeletal: Normal range of motion.  Neurological: He is alert and oriented to person, place, and time. Gait normal.  Skin: Skin is warm and dry.  Nursing note and vitals reviewed.  LABORATORY DATA:  CBC Latest Ref Rng & Units 06/23/2016 06/12/2016 06/02/2016  WBC 4.0 - 10.5 K/uL 4.2 5.2 6.0  Hemoglobin 13.0 - 17.0 g/dL 8.9(L) 8.9(L) 9.4(L)  Hematocrit 39.0 - 52.0 % 26.2(L) 26.4(L) 27.4(L)  Platelets 150 - 400 K/uL 107(L) 105(L) 120(L)   CMP Latest Ref Rng & Units 06/12/2016 06/02/2016 03/23/2016  Glucose 65 - 99 mg/dL 138(H) 157(H) 100(H)  BUN 6 - 20 mg/dL 41(H) 42(H) 42(H)  Creatinine 0.61 - 1.24 mg/dL 2.30(H) 2.24(H) 2.19(H)  Sodium 135 - 145 mmol/L 139 138 137  Potassium 3.5 - 5.1 mmol/L 4.2 4.5 4.5  Chloride 101 - 111 mmol/L 110 109 110  CO2 22 - 32 mmol/L _0 Calcium 8.9 - 10.3 mg/dL 8.5(L) 8.8(L) 8.6(L)  Total Protein 6.5 - 8.1 g/dL - 7.4 7.4  Total Bilirubin 0.3 - 1.2 mg/dL - 0.6 0.4  Alkaline Phos 38  - 126 U/L - 65 90  AST 15 - 41 U/L - 17 21  ALT 17 - 63 U/L - 10(L) 11(L)   RADIOGRAPHY: No results found.     PATHOLOGY:     ASSESSMENT/PLAN:  Multiple myeloma with focal 15-20% plasm cells; normal cytogenetics. CKD Anemia Low serum B12 Urothelial carcinoma  PLAN: I reviewed the bone marrow biopsy results with the patient in detail. He has multiple myeloma. I have discussed induction chemotherapy with revlimid, velcade, and dexamethasone (RVD) with the patient and he is willing to proceed. He is likely not a autotransplant candidate given his age. I will assess for tolerability to treatment, if he is not doing well, I will drop him down to a 2 drug regimen, either revlimid/dexamethasone, or velcade/dexamethasone.   I have reviewed side effects and scheduling of RVD with the patient in detail, however I will have our nurse navigator perform chemo teaching again.   He will return for follow up in 4 weeks with labs: CBC, CMP, SPEP, beta2 microglobulin, free kappa/lambda light chains, LDH.    All questions were answered. The patient knows to call the clinic with any problems, questions or concerns. We can certainly see the patient much sooner if necessary.  This document serves as a record of services personally performed by Twana First, MD. It was created on her behalf by Martinique Casey, a trained medical scribe. The creation of this record is based on the scribe's personal observations and the provider's statements to them. This document has been checked and approved by the attending provider.  I have reviewed the above documentation for accuracy and completeness and I agree with the above.  This note is electronically signed [id number] Sanda Klein  07/15/2016 10:22 AM

## 2016-07-15 NOTE — Progress Notes (Signed)
Told pt to call me when he picked up the meds from the pharmacy and we would go over them.

## 2016-07-15 NOTE — Patient Instructions (Addendum)
[place] at [facility] Discharge Instructions  RECOMMENDATIONS MADE BY THE CONSULTANT AND ANY TEST RESULTS WILL BE SENT TO YOUR REFERRING PHYSICIAN.  You were seen today by Dr. Twana First You will begin treatment on 3/19 We will be in contact with you about chemotherapy teaching Follow up in 4 weeks with lab work See Amy up front for appointments   Thank you for choosing [place] at [facility] to provide your oncology and hematology care.  To afford each patient quality time with our provider, please arrive at least 15 minutes before your scheduled appointment time.    If you have a lab appointment with the [facility] please come in thru the  Main Entrance and check in at the main information desk  You need to re-schedule your appointment should you arrive 10 or more minutes late.  We strive to give you quality time with our providers, and arriving late affects you and other patients whose appointments are after yours.  Also, if you no show three or more times for appointments you may be dismissed from the clinic at the providers discretion.     Again, thank you for choosing [facility].  Our hope is that these requests will decrease the amount of time that you wait before being seen by our physicians.       _____________________________________________________________  Should you have questions after your visit to [facility], please contact our office at [phone] between the hours of 8:30 a.m. and 4:30 p.m.  Voicemails left after 4:30 p.m. will not be returned until the following business day.  For prescription refill requests, have your pharmacy contact our office.       Resources For Cancer Patients and their Caregivers ? American Cancer Society: Can assist with transportation, wigs, general needs, runs Look Good Feel Better.        224-248-0293 ? Cancer Care: Provides financial assistance,  online support groups, medication/co-pay assistance.  1-800-813-HOPE 651-470-1808) ? [place] Assists [place] cancer patients and their families through emotional , educational and financial support.  832-888-2195 ? [place] DSS Where to apply for food stamps, Medicaid and utility assistance. [phone] 416-0034 ? RCATS: Transportation to medical appointments. 501-629-2325 ? Social Security Administration: May apply for disability if have a Stage IV cancer. [phone] 925-657-7614 ? [employer], Disability and Transit Services: Assists with nutrition, care and transit needs. [place] Support Programs: @10RELATIVEDAYS @ > Cancer Support Group  2nd Tuesday of the month 1pm-2pm, Journey Room  > Creative Journey  3rd Tuesday of the month 1130am-1pm, Journey Room  > Look Good Feel Better  1st Wednesday of the month 10am-12 noon, Journey Room (Call [place] to register [phone])

## 2016-07-16 ENCOUNTER — Telehealth (HOSPITAL_COMMUNITY): Payer: Self-pay | Admitting: Emergency Medicine

## 2016-07-16 ENCOUNTER — Telehealth (HOSPITAL_COMMUNITY): Payer: Self-pay | Admitting: Oncology

## 2016-07-16 NOTE — Telephone Encounter (Signed)
ENROLLED PT INTO THE REMS AND FAXED SCRIPT TO [facility]

## 2016-07-16 NOTE — Telephone Encounter (Signed)
Called pt to go over his medications he picked up from his pharmacy.  Explained he need to start taking his acyclovir now twice a day and this would help prevent shingles.  He had two nausea meds and they were only if he needed them.  He put a #1 on zofran bottle and #2 on the compazine bottle.  I explained that he would take 10 tablets of his steroids once a week once he started his chemo.  He verbalized understanding.

## 2016-07-20 ENCOUNTER — Telehealth (HOSPITAL_COMMUNITY): Payer: Self-pay | Admitting: Emergency Medicine

## 2016-07-20 NOTE — Telephone Encounter (Signed)
Pt called and had questions about his medications.  He also stated that he had a headache since he started taking acyclovir.  He has not taking anything for it.  I told him to take 2 tylenol extra strength and to call me in the morning to let me know if it was better.  He verbalized understanding.

## 2016-07-21 DIAGNOSIS — C679 Malignant neoplasm of bladder, unspecified: Secondary | ICD-10-CM | POA: Diagnosis not present

## 2016-08-02 DEATH — deceased

## 2016-08-12 ENCOUNTER — Other Ambulatory Visit (HOSPITAL_COMMUNITY): Payer: Self-pay | Admitting: Oncology

## 2016-08-12 DIAGNOSIS — C9000 Multiple myeloma not having achieved remission: Secondary | ICD-10-CM

## 2016-08-12 MED ORDER — LENALIDOMIDE 25 MG PO CAPS: ORAL_CAPSULE | ORAL | 0 refills | Status: AC

## 2016-09-12 ENCOUNTER — Other Ambulatory Visit: Payer: Self-pay | Admitting: Nurse Practitioner
